# Patient Record
Sex: Female | Born: 1975 | Race: White | Hispanic: No | Smoking: Never smoker
Health system: Southern US, Community
[De-identification: ages and names within clinical notes are randomized; demographics above are authoritative.]

## PROBLEM LIST (undated history)

## (undated) DIAGNOSIS — M79602 Pain in left arm: Secondary | ICD-10-CM

## (undated) DIAGNOSIS — I447 Left bundle-branch block, unspecified: Secondary | ICD-10-CM

## (undated) DIAGNOSIS — R002 Palpitations: Secondary | ICD-10-CM

## (undated) DIAGNOSIS — R079 Chest pain, unspecified: Secondary | ICD-10-CM

## (undated) DIAGNOSIS — R0600 Dyspnea, unspecified: Secondary | ICD-10-CM

## (undated) HISTORY — PX: TONSILLECTOMY AND ADENOIDECTOMY: SHX28

## (undated) HISTORY — DX: Palpitations: R00.2

## (undated) HISTORY — DX: Pain in left arm: M79.602

## (undated) HISTORY — DX: Chest pain, unspecified: R07.9

## (undated) HISTORY — DX: Left bundle-branch block, unspecified: I44.7

## (undated) HISTORY — DX: Dyspnea, unspecified: R06.00

---

## 1998-11-18 ENCOUNTER — Other Ambulatory Visit: Admission: RE | Admit: 1998-11-18 | Discharge: 1998-11-18 | Payer: Self-pay | Admitting: *Deleted

## 1999-01-09 ENCOUNTER — Inpatient Hospital Stay (HOSPITAL_COMMUNITY): Admission: AD | Admit: 1999-01-09 | Discharge: 1999-01-09 | Payer: Self-pay | Admitting: Obstetrics & Gynecology

## 1999-01-09 ENCOUNTER — Encounter: Payer: Self-pay | Admitting: Obstetrics & Gynecology

## 1999-08-28 ENCOUNTER — Inpatient Hospital Stay (HOSPITAL_COMMUNITY): Admission: AD | Admit: 1999-08-28 | Discharge: 1999-08-30 | Payer: Self-pay | Admitting: Obstetrics & Gynecology

## 1999-08-31 ENCOUNTER — Encounter: Admission: RE | Admit: 1999-08-31 | Discharge: 1999-11-29 | Payer: Self-pay | Admitting: *Deleted

## 1999-11-29 ENCOUNTER — Other Ambulatory Visit: Admission: RE | Admit: 1999-11-29 | Discharge: 1999-11-29 | Payer: Self-pay | Admitting: Obstetrics & Gynecology

## 2000-12-08 ENCOUNTER — Other Ambulatory Visit: Admission: RE | Admit: 2000-12-08 | Discharge: 2000-12-08 | Payer: Self-pay | Admitting: Obstetrics and Gynecology

## 2002-01-07 ENCOUNTER — Inpatient Hospital Stay (HOSPITAL_COMMUNITY): Admission: AD | Admit: 2002-01-07 | Discharge: 2002-01-07 | Payer: Self-pay | Admitting: Obstetrics and Gynecology

## 2002-01-23 ENCOUNTER — Inpatient Hospital Stay (HOSPITAL_COMMUNITY): Admission: AD | Admit: 2002-01-23 | Discharge: 2002-01-25 | Payer: Self-pay | Admitting: Obstetrics and Gynecology

## 2002-03-07 ENCOUNTER — Other Ambulatory Visit: Admission: RE | Admit: 2002-03-07 | Discharge: 2002-03-07 | Payer: Self-pay | Admitting: Obstetrics and Gynecology

## 2003-02-19 ENCOUNTER — Other Ambulatory Visit: Admission: RE | Admit: 2003-02-19 | Discharge: 2003-02-19 | Payer: Self-pay | Admitting: Obstetrics and Gynecology

## 2004-08-04 ENCOUNTER — Other Ambulatory Visit: Admission: RE | Admit: 2004-08-04 | Discharge: 2004-08-04 | Payer: Self-pay | Admitting: Obstetrics and Gynecology

## 2005-03-24 ENCOUNTER — Inpatient Hospital Stay (HOSPITAL_COMMUNITY): Admission: AD | Admit: 2005-03-24 | Discharge: 2005-03-26 | Payer: Self-pay | Admitting: Obstetrics and Gynecology

## 2005-08-10 ENCOUNTER — Ambulatory Visit (HOSPITAL_COMMUNITY): Admission: RE | Admit: 2005-08-10 | Discharge: 2005-08-10 | Payer: Self-pay | Admitting: Family Medicine

## 2006-02-16 ENCOUNTER — Other Ambulatory Visit: Admission: RE | Admit: 2006-02-16 | Discharge: 2006-02-16 | Payer: Self-pay | Admitting: Obstetrics and Gynecology

## 2006-09-25 ENCOUNTER — Encounter: Admission: RE | Admit: 2006-09-25 | Discharge: 2006-09-25 | Payer: Self-pay | Admitting: Obstetrics and Gynecology

## 2008-03-14 ENCOUNTER — Encounter (INDEPENDENT_AMBULATORY_CARE_PROVIDER_SITE_OTHER): Payer: Self-pay | Admitting: Obstetrics and Gynecology

## 2008-03-14 ENCOUNTER — Ambulatory Visit (HOSPITAL_COMMUNITY): Admission: RE | Admit: 2008-03-14 | Discharge: 2008-03-14 | Payer: Self-pay | Admitting: Obstetrics and Gynecology

## 2008-12-10 ENCOUNTER — Inpatient Hospital Stay (HOSPITAL_COMMUNITY): Admission: AD | Admit: 2008-12-10 | Discharge: 2008-12-10 | Payer: Self-pay | Admitting: Obstetrics and Gynecology

## 2009-01-26 ENCOUNTER — Inpatient Hospital Stay (HOSPITAL_COMMUNITY): Admission: AD | Admit: 2009-01-26 | Discharge: 2009-01-28 | Payer: Self-pay | Admitting: Obstetrics and Gynecology

## 2011-01-19 LAB — CBC
HCT: 34.4 % — ABNORMAL LOW (ref 36.0–46.0)
Hemoglobin: 11.8 g/dL — ABNORMAL LOW (ref 12.0–15.0)
MCHC: 35.3 g/dL (ref 30.0–36.0)
MCV: 95.2 fL (ref 78.0–100.0)
Platelets: 205 10*3/uL (ref 150–400)
Platelets: 227 10*3/uL (ref 150–400)
RBC: 3.56 MIL/uL — ABNORMAL LOW (ref 3.87–5.11)
RDW: 13.6 % (ref 11.5–15.5)
WBC: 10 10*3/uL (ref 4.0–10.5)
WBC: 9.9 10*3/uL (ref 4.0–10.5)

## 2011-01-19 LAB — RPR: RPR Ser Ql: NONREACTIVE

## 2011-01-20 LAB — DIFFERENTIAL
Basophils Absolute: 0.1 10*3/uL (ref 0.0–0.1)
Basophils Relative: 1 % (ref 0–1)
Eosinophils Absolute: 0.1 10*3/uL (ref 0.0–0.7)
Eosinophils Relative: 1 % (ref 0–5)
Lymphocytes Relative: 18 % (ref 12–46)
Lymphs Abs: 1.7 10*3/uL (ref 0.7–4.0)
Monocytes Absolute: 0.7 10*3/uL (ref 0.1–1.0)
Monocytes Relative: 7 % (ref 3–12)
Neutro Abs: 7.2 10*3/uL (ref 1.7–7.7)
Neutrophils Relative %: 73 % (ref 43–77)

## 2011-01-20 LAB — CBC
HCT: 32.7 % — ABNORMAL LOW (ref 36.0–46.0)
Hemoglobin: 11.2 g/dL — ABNORMAL LOW (ref 12.0–15.0)
MCHC: 34.4 g/dL (ref 30.0–36.0)
MCV: 95.8 fL (ref 78.0–100.0)
Platelets: 215 10*3/uL (ref 150–400)
RBC: 3.41 MIL/uL — ABNORMAL LOW (ref 3.87–5.11)
RDW: 13.1 % (ref 11.5–15.5)
WBC: 9.8 10*3/uL (ref 4.0–10.5)

## 2011-01-20 LAB — URINALYSIS, ROUTINE W REFLEX MICROSCOPIC
Bilirubin Urine: NEGATIVE
Glucose, UA: NEGATIVE mg/dL
Hgb urine dipstick: NEGATIVE
Ketones, ur: NEGATIVE mg/dL
Nitrite: NEGATIVE
Protein, ur: NEGATIVE mg/dL
Specific Gravity, Urine: 1.015 (ref 1.005–1.030)
Urobilinogen, UA: 0.2 mg/dL (ref 0.0–1.0)
pH: 7 (ref 5.0–8.0)

## 2011-01-20 LAB — CULTURE, BETA STREP (GROUP B ONLY)

## 2011-01-20 LAB — GC/CHLAMYDIA PROBE AMP, GENITAL
Chlamydia, DNA Probe: NEGATIVE
GC Probe Amp, Genital: NEGATIVE

## 2011-01-20 LAB — WET PREP, GENITAL
Clue Cells Wet Prep HPF POC: NONE SEEN
Trich, Wet Prep: NONE SEEN
Yeast Wet Prep HPF POC: NONE SEEN

## 2011-02-11 ENCOUNTER — Other Ambulatory Visit: Payer: Self-pay | Admitting: Obstetrics and Gynecology

## 2011-02-15 ENCOUNTER — Ambulatory Visit
Admission: RE | Admit: 2011-02-15 | Discharge: 2011-02-15 | Disposition: A | Discharge status: Home / Self Care | Payer: BC Managed Care – PPO | Source: Ambulatory Visit | Attending: Obstetrics and Gynecology | Admitting: Obstetrics and Gynecology

## 2011-02-15 ENCOUNTER — Other Ambulatory Visit: Payer: Self-pay | Admitting: Obstetrics and Gynecology

## 2011-02-22 NOTE — Op Note (Signed)
NAME:  Denise Fowler, Denise Fowler              ACCOUNT NO.:  [id number]   MEDICAL RECORD NO.:  [id number]          PATIENT TYPE:  AMB   LOCATION:  SDC                           FACILITY:  [facility]   PHYSICIAN:  Hal Morales, M.D.DATE OF BIRTH:  12-19-1975   DATE OF PROCEDURE:  DATE OF DISCHARGE:                               OPERATIVE REPORT   PREOPERATIVE DIAGNOSIS:  Missed abortion at 69 weeks' gestation.   POSTOPERATIVE DIAGNOSIS:  Missed abortion at 12 weeks' gestation.   OPERATION:  Suction dilatation and evacuation.   SURGEON:  Hal Morales, MD   ANESTHESIA:  General mask.   FINDINGS:  The uterus was enlarged to 12-week size with a large amount  of products of conception.   SPECIMENS:  Products of conception.   DISPOSITION:  Sent to pathology.   ESTIMATED BLOOD LOSS:  Less than 50 mL.   COMPLICATIONS:  None.   FINDINGS:  The uterus was enlarged to 12-week size with a large amount  of products of conception.   PROCEDURE:  The patient was taken to the operating room after  appropriate identification and placed on the operating table.  After the  attainment of adequate general anesthesia, she was placed in lithotomy  position.  The perineum and vagina were prepped with multiple layers of  Betadine and draped as a sterile field.  A red Robinson catheter was  used to empty the bladder.  A Graves speculum was placed in the vagina.  A paracervical block was achieved with a total of 10 mL of 2% Xylocaine  in the 5 and 7 o'clock positions.  A single-tooth tenaculum was placed  on the cervix.  The cervix was dilated to accommodate a #12 suction  catheter, and it was used to suction all contents from all quadrants of  the uterus.  A sharp curette was used to ensure that no further products  of conception could be identified.  Hemostasis was noted to be adequate.  The patient was awakened from general anesthesia and taken to the  recovery room in satisfactory condition having  tolerated the procedure  well with sponge and instrument counts correct.  She received Toradol 30  mg IV and 30 mg IM in the operating room.  She received Methergine 0.2  mg IM in the operating room.   DISCHARGE MEDICATIONS:  1. Ibuprofen 600 mg p.o. q.6 h. p.r.n. pain.  2. Methergine 0.2 mg p.o. q.6 h. x8 doses.  3. Doxycycline 100 mg p.o. b.i.d. for 7 days.   DISCHARGE INSTRUCTIONS:  Printed instructions from the [facility]  for [facility].   FOLLOW UP INSTRUCTIONS:  The patient has a 2-week appointment to follow  up with Dr. Pennie Rushing.      Hal Morales, M.D.  Electronically Signed     VPH/MEDQ  D:  03/14/2008  T:  03/15/2008  Job:  [id number]

## 2011-02-22 NOTE — H&P (Signed)
NAMEJOHNNA, BOLLIER NO.:  [id number]   MEDICAL RECORD NO.:  [id number]          PATIENT TYPE:  INP   LOCATION:  9165                          FACILITY:  [facility]   PHYSICIAN:  Janine Limbo, M.D.DATE OF BIRTH:  02-23-1976   DATE OF ADMISSION:  01/26/2009  DATE OF DISCHARGE:                              HISTORY & PHYSICAL   Ms. Kendra is a 35 year old gravida 6, para 3-0-2-3 at 37 weeks who  presented complaining of uterine contractions every 5-6 minutes for  several hours.  Her cervix has been 2-3 in the office today.   PREGNANCY:  Has been remarkable for:  1. Negative group B strep.  2. Preterm labor this pregnancy.  3. Two previous 36-37-week deliveries.   PRENATAL LABS:  Blood type is A+, Rh antibody negative.  Urine  nonreactive.  Rubella titer positive.  Hepatitis B surface antigen  negative.  HIV is nonreactive.  GC and chlamydia cultures were declined.  Pap was normal in July 2009.  Cystic fibrosis testing was declined.  First trimester screen was normal.  AFP was declined.  Hemoglobin upon  entering the practice was 12.6; it was 10.9 at 27 weeks.  She had a  normal Glucola.  She had a fetal fibronectin on December 10, 2008 that was  negative, and on December 25, 2008 that was negative.  Group B strep  culture was negative at 36 weeks.   HISTORY OF PRESENT PREGNANCY:  The patient entered care at approximately  10 weeks.  She had an ultrasound that day for inability to hear fetal  heart tones.  Her EDC was Feb 16, 2009, which was consistent with dates.  She planned first trimester screen.  HIV was negative.  First trimester  screen was normal.  AFP was declined.  She had an 18-week ultrasound  which showed normal growth and development.  She was treated for a  dental abscess at 22 weeks.  At 29 weeks she began to have some preterm  labor symptoms.  She was seen in maternity admissions unit; fetal  fibronectin was negative.  She was placed on Motrin for 24  hours and  then terbutaline p.r.n.  She just used the terbutaline on a p.r.n. basis  until 36 weeks.  Fetal fibronectin was repeated at 32 weeks and was  negative.  She had a beta strep test done at 35 weeks, which was  negative.  She had an ultrasound at 32 weeks, showing normal growth and  fluid, and a normal cervical length.  The rest of her pregnancy has been  uncomplicated.   OBSTETRICAL HISTORY:  1. In 2000 she had a vaginal birth of a female infant; weight 7 pounds      3 ounces at 39 weeks.  She was in labor 6 hours.  She had positive      beta strep with that pregnancy.  2. In 2003 she had a vaginal birth of a female infant; weight 7 pounds      13 ounces at 37-5/7 weeks.  She had 3 hours of labor.  She had  local anesthesia with that.  3. In 2005 she had a first trimester loss, with a faint positive EPT      and a negative HCG.  4. In 2006 she had a vaginal birth of a female infant; weight 6 pounds      13 ounces at 36-37 weeks.  She was in labor 3 hours.  She did have      an epidural and had no problems.  5. In June 2009 she had a first trimester loss and had a D and C.  6. She was a previous oral contraceptive user.  7. She had beta strep with her first pregnancy.   MEDICAL HISTORY:   ALLERGIES:  She is allergic to PENICILLIN (this was a childhood  reaction).   SURGICAL HISTORY:  Includes:  1. Tonsils at age 77.  2. Wisdom teeth at age 79.  3. Previously noted D and C in 2009.   She also some occasional anemia.   FAMILY HISTORY:  Maternal grandfather had chronic hypertension.  Maternal grandfather had diabetes.  Paternal grandfather had prostate  cancer.  Maternal grandfather had brain cancer.  Maternal grandmother  had bladder cancer.  Mother and maternal grandfather have depression.   GENETIC HISTORY:  Remarkable for the father of the baby and the patient  being very distant cousins.   SOCIAL HISTORY:  The patient is married to the father of baby; he  is  involved and supportive (his name is Adalberto Cole).  The patient is  Caucasian.  She is of the [place] faith.  She has a bachelor's degree  and she is a Designer, jewellery and a Designer, television/film set.  Her husband has  a bachelor's degree.  He is employed at [employer].  She has been followed by  the certified nurse midwife service [facility].  She denies any  alcohol, drug or tobacco use during this pregnancy.   PHYSICAL EXAMINATION:  VITAL SIGNS:  Stable.  The patient is febrile.  HEENT:  Within normal limits.  LUNGS:  Breath sounds are clear.  HEART:  Regular rate and rhythm without murmur.  BREASTS:  Soft and nontender.  ABDOMEN:  Fundal height is approximately 38 cm.  Estimated fetal weight  6-7 pounds.  Uterine contractions are every 4-5 minutes; 60th and 2nd of  moderate quality.  CERVIX:  External os 5 cm, internal os 3-4 cm; 60% vertex at a minus two  station, bulging bag of water.  Positive bloody show.  EXTREMITIES:  Deep tendon reflexes are 2+ without clonus.  There is a  trace edema noted.   IMPRESSION:  1. Intrauterine pregnancy at 37 weeks.  2. Early labor.  3. Group B strep negative.   PLAN:  1. Admit to birthing suite, for consult with Dr. Marline Backbone as      attending physician.  2. Routine certified nurse midwife orders.  3. We will encourage ambulation at present and then reevaluate for      epidural and artificial rupture of membranes later.      Renaldo Reel Emilee Hero, C.N.M.      Janine Limbo, M.D.  Electronically Signed    VLL/MEDQ  D:  01/26/2009  T:  01/26/2009  Job:  [id number]

## 2011-02-22 NOTE — H&P (Signed)
NAME:  Denise Fowler, Denise Fowler              ACCOUNT NO.:  [id number]   MEDICAL RECORD NO.:  [id number]          PATIENT TYPE:  AMB   LOCATION:  SDC                           [facility]:  [facility]   PHYSICIAN:  Hal Morales, M.D.DATE OF BIRTH:  [birth year]   DATE OF ADMISSION:  DATE OF DISCHARGE:                              HISTORY & PHYSICAL   Denise Fowler is a 35 year old married white female, gravida 5, para 2-1-  1-3, who presented to the [facility] office for first  trimester screen but was shown by ultrasound to have a 12-week 2-day  intrauterine fetal demise.  She denies any pain or any vaginal bleeding.  Her pregnancy has been followed by the [facility] certified  nurse midwife service and has been remarkable for:   1. Penicillin allergy.  2. Irregular cycles.   Her prenatal labs were collected on Feb 15, 2008:  Hemoglobin 11.5,  hematocrit 34.2, platelets 339,000.  Blood type A+, antibody negative.  RPR nonreactive.  Rubella immune.  Hepatitis B surface antigen negative.  HIV nonreactive.  Pap smear, gonorrhea and chlamydia cultures were not  done but were planning to be done after March 10, 2008.  The patient's  weight at her visit today was 133 pounds.   HISTORY OF PRESENT PREGNANCY:  The patient presented for a first  trimester ultrasound on February 06, 2008, at 7 weeks 6 days with best [facility]  September 18, 2008.  Fetal heart tones at that ultrasound were 171.  She  had her new OB workup on Feb 15, 2008, at 9-1/7 weeks' gestation.  No  heart tones were auscultated, and then the next visit was today for her  first trimester screen.   OB HISTORY:  She is a gravida 5, para 2-1-1-3.  In November 2000 she had  a vaginal delivery of a female infant weighing 7 pounds 3 ounces at 41  weeks' gestation after 6 hours of labor.  She had no anesthesia.  Infant's name was Jeanie Cooks.  In April 2003 she had a vaginal delivery of a  female infant weighing 7 pounds 13 ounces at 37-5/7  weeks' gestation  after 3 hours of labor.  She had no anesthesia.  Infant's name was  Dahlia Client.  In May 2005 she had a possible early SAB with faint positive  HPT and negative serum pregnancy test.  In June 2006 she had a vaginal  delivery of a female infant weighing 6 pounds 13 ounces at 36 weeks'  gestation after 3 hours of labor.  She had an epidural for anesthesia.  Infant's name was Orpha Bur.  This fifth pregnancy is the current pregnancy.   PAST MEDICAL HISTORY:  She has a PENICILLIN allergy.   She experienced menarche at the age of 5 with irregular cycles lasting  5 days.  She has taken oral contraceptives in the past.  She has an  occasional yeast infection.  She had group B strep with her first  pregnancy.  She reports having had the usual childhood illnesses.  She  has an occasional anemia that has been diagnosed.   Surgical  history is remarkable for a tonsillectomy at the age of 50,  wisdom teeth extraction at the age of 75.   FAMILY MEDICAL HISTORY:  Maternal grandfather with chronic hypertension  and maternal grandfather also with diabetes.  Paternal grandfather with  brain cancer.  Mother and maternal grandfather with depression.   GENETIC HISTORY:  Remarkable that father of the baby is a distant  cousin.   SOCIAL HISTORY:  Patient is married to the father of the baby.  His name  is Thayer Ohm.  He is involved and supportive.  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  Father of the baby has his bachelor's and works full-time a Arts administrator.   OBJECTIVE:  VITAL SIGNS:  Stable.  She is afebrile.  HEENT:  Grossly within normal limits.  CHEST:  Clear to auscultation.  HEART:  Regular rate and rhythm.  ABDOMEN:  Gravid in contour with fundal height extending approximately  12 cm above pubic symphysis.  Abdomen is soft and nontender.  EXTREMITIES:  Within normal limits.   Ultrasound at [facility] shows a 12-week 2-day fetus  with crown-  rump length of 5.76 cm with absent cardiac rhythm and anterior placenta.   ASSESSMENT:  Missed abortion at 12 weeks 2 days.   PLAN:  Options were reviewed with the patient including expectant  management and surgical management.  The patient elects for a D&C as  soon as possible.  She last ate at 8 a.m. and per Dr. Pennie Rushing, plan is  for the patient to report to [facility] for a D&C at approximately  3:15 p.m. this afternoon, which is March 14, 2008.      Cam Hai, C.N.M.      Hal Morales, M.D.  Electronically Signed    KS/MEDQ  D:  03/14/2008  T:  03/14/2008  Job:  [id number]

## 2011-02-25 NOTE — H&P (Signed)
[facility]  Patient:    Denise Fowler, Denise Fowler Visit Number: [id number] MRN: [id number]          Service Type: OBS Location: [id number] Attending Physician:  Jaymes Graff A Dictated by:   Nigel Bridgeman, C.N.M. Admit Date:  01/23/2002                           History and Physical  CHIEF COMPLAINT:              Ms. Gotschall is a 35 year old gravida 2 para 1, 0-0-1, at 9 weeks, who presents for induction secondary to prolonged prodromal labor, history of rapid labor, favorable cervix, and distance from the hospital.  HISTORY OF PRESENT ILLNESS:   The pregnancy has been remarkable for:                               1. History of rapid labor.                               2. Patient is an R.N.                               3. History of preterm labor, with cervix 2-3 cm                                  at 35 weeks.                               4. Prolonged prodromal labor.  PRENATAL LABORATORY DATA:     Blood type is A-positive.  Rh antibody negative. VDRL nonreactive.  Rubella titer positive.  Hepatitis B surface antigen negative.  HIV nonreactive.  GC and Chlamydia cultures were negative.  Pap smear normal in March 2003.  Glucose challenge was normal.  AFP was declined. Hemoglobin upon entry into practice was 12.1, was 11.3 at 27 weeks. Group B strep culture was negative during this pregnancy.  EDC of Feb 08, 2002 was established by last menstrual period and is in agreement with ultrasound at approximately six and 19 weeks.  HISTORY OF PRESENT PREGNANCY: The patient entered care at approximately ten weeks.  She had had an intrauterine pregnancy documented by ultrasound in September 2002.  She had another ultrasound at 19 weeks that showed normal growth and fluid.  She declined AFP.  She did have some severe back pain beginning at approximately 30 weeks.  She was referred to [facility].  Beta strep was recollected at 35 weeks, it was negative.  She began  to have contractions at approximately 35 weeks and the cervix was 2.5, 70%, vertex at -1 station. From that point on she continued to have significant ongoing contractions. She did have blood and ketones in her urine and protein at 36 weeks.  This was evaluated with a 24 hour value that showed 273 mg of protein but also had blood, but then that did clear after 36 weeks.  Again, she continued to contract significantly, although her cervix never advanced beyond 3-4 cm dilated.  She had significant exhaustion.  Therefore, she was admitted today for induction secondary to the issues as detailed  previously.  PAST OBSTETRICAL HISTORY:     In November 2000 she had a vaginal birth of a female infant, weight 7 pounds 3 ounces, at 39 weeks.  She was in labor six hours.  She had no complications.  She had a second degree laceration.  PAST MEDICAL HISTORY:         1. She was on oral contraceptives in the past.                               2. She had a history of previous irregular                                  cycles and took Prometrium every month for                                  menses after her last pregnancy.                               3. She has occasional yeast infections.                               4. She was positive for beta strep last                                  pregnancy, but her infant had no effect.                               5. She reports usual childhood illnesses.                               6. She has a history of a heart murmur but                                  no medications and no problems.                               7. She has a history of mild anemia.                               8. She had a UTI in February 2000.                               9. At age 64 she had her tonsils removed.                              10. At age 39 she had her wisdom teeth taken  out.                              11. Her only other  hospitalization was for                                  childbirth.  ALLERGIES:                    PENICILLIN, which causes a rash.  FAMILY HISTORY:               Her maternal grandfather had hypertension.  Her paternal great-grandmother had varicosities.  Her mother and maternal grandmother and paternal grandmother all had anemia.  Her maternal grandfather had non-insulin dependent diabetes.  Paternal grandfather died of brain cancer.  Maternal grandfather had depression.  Her mother also has had a history of depression.  GENETIC HISTORY:              Remarkable for the father of the baby having a distant cousin with mental retardation.  SOCIAL HISTORY:               The patient is married to the father of the baby.  He is involved and supportive.  His name is Ritu Gagliardo.  The patient is Caucasian, of the [address] faith.  She is college educated.  She is employed as a Designer, jewellery at [employer].  The patients husband has one year of technical school.  He is employed as a Presenter, broadcasting. She has been followed by the Certified Nurse Midwife service at [facility].  She denies any alcohol, drug, or tobacco use during this pregnancy.  PHYSICAL EXAMINATION:  VITAL SIGNS:                  Stable.  The patient is afebrile.  HEENT:                        Within normal limits.  LUNGS:                        Bilateral breath sounds are clear.  HEART:                        Regular rate and rhythm without murmur.  BREAST:                       Soft, nontender.  ABDOMEN:                      Fundal height approximately 38 cm.  Estimated fetal weight 6-1/2 to 7 pounds.  Uterine contractions are every five to eight minutes, mild to moderate in quality.  PELVIC:                       Cervical examination on admission 3-4 cm, 75%, vertex at -1 station.  Fetal heart rate is reactive with no decelerations. There was an episode of baseline 160-170 for 15-20  minutes but then that did resolve itself to the original baseline.  No decelerations were noted.  EXTREMITIES:                  Deep tendon reflexes are 2+ without clonus.  There is trace  edema noted.  IMPRESSION:                   1. Intrauterine pregnancy at 38 weeks.                               2. Prolonged prodromal labor.                               3. History of rapid labor.                               4. Favorable cervix.                               5. Distance from hospital.  PLAN:                         1. Admit to birthing suite per consult                                  with Dr. Silverio Lay, attending                                  physician.                               2. Routine Certified Nurse Midwife orders.                               3. Plan artificial rupture of membranes and                                  augmentation with Pitocin on as as needed                                  basis.                               4. Patient declines epidural at this time but                                  will reevaluate as time goes along. Dictated by:   Nigel Bridgeman, C.N.M. Attending Physician:  Michael Litter DD:  01/23/02 TD:  01/23/02 Job: [id number]

## 2011-02-25 NOTE — H&P (Signed)
Denise Fowler, Denise Fowler              ACCOUNT NO.:  [id number]   MEDICAL RECORD NO.:  [id number]          PATIENT TYPE:  INP   LOCATION:  9160                          FACILITY:  [facility]   PHYSICIAN:  Hal Morales, M.D.DATE OF BIRTH:  [birth year]   DATE OF ADMISSION:  03/24/2005  DATE OF DISCHARGE:                                HISTORY & PHYSICAL   Denise Fowler is a 35 year old gravida 4, para 2-0-1-2 at 36-1/7 weeks who  presented with uterine contractions every two to three minutes.  During the  night she denies leaking or bleeding and reports positive fetal movement.  Pregnancy has been remarkable for group B Strep culture pending from June  12.  Sensitivities were requested secondary to penicillin allergy, small  body habitus, irregular cycles, penicillin allergy.   PRENATAL LABORATORIES:  Blood type is A+.  Rh antibody negative.  VDRL  nonreactive.  Rubella titer positive.  Hepatitis B surface antigen negative.  HIV and cystic fibrosis testing were declined.  GC and Chlamydia cultures  were declined.  Pap was normal.  AFP was declined.  Hemoglobin upon entering  to practice was 12.  It was 11.7 at 27 weeks.  EDC of April 19, 2005 was  established by 9-week ultrasound and was in agreement with LMP.  Group B  Strep culture was done on June 12 and is currently pending.   HISTORY OF PRESENT PREGNANCY:  Patient began prenatal care at approximately  8 weeks.  She had an ultrasound approximately 9 weeks for dating.  She  declined quadruple screen.  She had an ultrasound at 18 weeks showing normal  growth and development.  She had a normal Glucola at 107.  She had cramping  beginning at approximately 29 weeks.  She was free of her UTI at that time.  Fetal fibronectin was sent which was negative.  Fetal fibronectin was  repeated again at 31 weeks which was again negative.  Her cervix was 1 cm  external os, closed internal os at 31 weeks.  The rest of her pregnancy was  essentially  uncomplicated; however, she did have significant cramping  throughout the rest of her pregnancy.  Over the last two to three days she  has noted increased contractions.   PAST OBSTETRICAL HISTORY:  In 2000 she had a vaginal birth of a female  infant weight 7 pounds 3 ounces at 39 weeks.  She had six hours of labor.  She was positive group B Strep during that pregnancy.  In 2003 she had a  vaginal birth of a female infant weight 7 pounds 13 ounces at 37-5/7 weeks.  She was in labor three hours.  She had local anesthesia.  She had negative  group B Strep at that time.  In May of 2005 she had questionable early SAB.  She had a faint positive home EPT, but a negative serum pregnancy test.   PAST MEDICAL HISTORY:  She was a pregnancy oral contraceptive user.  She has  a history of irregular cycles, occasional yeast infections.  She reports  usual childhood illnesses.  She has occasional anemia.  PAST SURGICAL HISTORY:  Tonsils at age 19, wisdom teeth at age 51.  Her only  other hospitalizations were for childbirth.   ALLERGIES:  PENICILLIN which was a childhood reaction with a rash.   FAMILY HISTORY:  Maternal grandfather has hypertension.  Maternal great  grandmother had varicosities.  Maternal grandfather had non-insulin-  dependent diabetes.  Maternal grandfather and mother have depression.  Maternal grandfather had brain cancer.   GENETIC HISTORY:  Remarkable for the father of the baby being a distant  cousin.   SOCIAL HISTORY:  Patient is married to the father of the baby.  He is  involved and supportive.  His name is Matie Dimaano.  Patient has graduate  education.  She is employed as a Designer, jewellery with a drug company.  Her  husband has one year of college.  He is employed as a Scientist, water quality.  She has  been followed by the certified nurse midwife service at [facility].  She denies any alcohol, drug, or tobacco use during this pregnancy.   PHYSICAL EXAMINATION:   VITAL SIGNS:  Stable.  Patient is afebrile.  HEENT:  Within normal limits.  LUNGS:  Bilateral breath sounds are clear.  HEART:  Regular rate and rhythm without murmur.  BREASTS:  Soft and nontender.  ABDOMEN:  Fundal height is approximately 36 cm.  Estimated fetal weight 6  pounds.  Uterine contractions are every two to four minutes, moderate to  strong quality.  PELVIC:  Cervical examination is 2-3 cm, 60%, vertex at a -1 station with  cervix more anterior after ambulation.  EXTREMITIES:  Deep tendon reflexes are 2+ without clonus.  There is a trace  edema noted.  Fetal heart rate is reactive with no decelerations and a  negative spontaneous CFP.   IMPRESSION:  1.  Intrauterine pregnancy at 36-1/7 weeks.  2.  Early labor.  3.  Pending group B Strep.   PLAN:  1.  Admit to birthing suite for consult with Dr. Osborn Coho as the      current on-call physician.  2.  Routine certified nurse midwife orders.  3.  Will plan group B Strep prophylaxis with clindamycin secondary to      pending group B Strep culture, 36-week gestation, and penicillin      allergy.  4.  Artificial rupture of membranes after antibiotic infused.  5.  Patient declines pain medication at this time.       VLL/MEDQ  D:  03/24/2005  T:  03/24/2005  Job:  [id number]

## 2011-02-25 NOTE — H&P (Signed)
[facility]  Patient:    Denise Fowler                        MRN: [id number] Adm. Date:  [id number] Attending:  Cleatrice Burke Dictator:   Erin Sons, C.N.M.                         History and Physical  HISTORY OF PRESENT ILLNESS:   Denise Fowler is a 35 year old, gravida 1, para 0, at 39-1/7 weeks who presented to maternity admissions unit with cramping today. Cervix had been 4 cm in the office this week.  She denied any leaking or bleeding and reports positive fetal movement.  Pregnancy has been remarkable for: 1) Positive Group B Strep.  2) Penicillin allergy.  3) UTI first trimester. 4) Right breast lump.  PRENATAL LABORATORY DATA:     Blood type is A positive, rh antibody negative, VDRL nonreactive, rubella titer positive, hepatitis B surface antigen negative, HIV nonreactive, Glucose Challenge was normal, AFP was normal.  Hemoglobin upon entry into practice was 13.  It was 10.6 at 27 weeks.  EDC of September 03, 1999, was established by last menstrual period and was in agreement with ultrasound at approximately seven weeks. In addition Group B Strep culture was positive on urine in the first trimester.  HISTORY OF PRESENT PREGNANCY: The patient entered care at approximately 10 weeks. She had had a positive urine culture and UTI which was treated with Cleocin. She had an ultrasound at [facility] at seven weeks documented positive intrauterine pregnancy. She did have a right breast lump that was evaluated at her new OB visit.  She did have an upper respiratory tract infection during her pregnancy at approximately 31 weeks for which she received a Z-Pak.  She had decreased fetal movement at approximately 30 weeks and had a reactive NST.  She had no other prenatal complications.  PAST OBSTETRIC HISTORY:       The patient is a primigravida.  PAST MEDICAL HISTORY:         She was on oral contraceptives until July of 1999. She did have  a history of irregular menses and took Prometrium every month for menses since August of 1999.  She reports one yeast infection in the past.  She has the usual childhood diseases.  She has a history of heart murmurs, but has never required any prophylaxis.  She does have a history in the past of anemia, but no problems.  She had a UTI in February of 2000.  At age 62 she had a tonsillectomy. At age 35 she had her wisdom teeth removed.  FAMILY HISTORY:               Her maternal grandfather had hypertension.  Her paternal great grandmother had thrombophlebitis.  Her mother, maternal grandmother, and paternal grandmother all were anemic.  Her maternal grandfather had non-insulin-dependent diabetes mellitus.  Her paternal grandfather had brain cancer and is now deceased.  Her paternal grandmother had an inner ear problem.  Her maternal grandfather had depression and her mother also had questionable depression.  GENETIC HISTORY:              Remarkable for the father of the infant having a fourth cousin with cystic fibrosis.  SOCIAL HISTORY:               The patient is married to the father of the baby.  He is involved and supportive.  His name is Tyronda Vizcarrondo.  The patient is Caucasian and of the [facility] faith.  She has been followed by the certified nurse midwife  service of [facility] and Gynecology.  She denies any alcohol,  drug, or tobacco use during this pregnancy.  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  Her husband as one year of college and is employed as a Scientist, water quality.  ALLERGIES:                    PENICILLIN which causes a rash.  PHYSICAL EXAMINATION:  VITAL SIGNS:                  Stable.  The patient is afebrile.  HEENT:                        Within normal limits.  LUNGS:                        Bilateral breath sounds are clear.  HEART:                        Regular rate and rhythm  without murmur.  BREASTS:                      Soft and nontender.  ABDOMEN:                      Fundal height is approximately 38 cm.  Estimated fetal weight 7 to 7-1/2 pounds.  Uterine contractions are irregular and mild.  PELVIC:                       Cervical examination 6+ cm, 100%, and vertex at -1 station with bulging bag of water noted.  Fetal heart rate is reactive without decellerations.  EXTREMITIES:                  Deep tendon reflexes are 2+ without clonus. There is a trace edema noted.  IMPRESSION:                   1. Intrauterine pregnancy at 39-1/7 weeks.                               2. Advanced cervical change with minimal labor.                               3. Positive Group B Strep.                               4. Penicillin allergy.  PLAN:                         1) Admit to birthing suite per consult with Cecilio Asper, M.D. as attending physician.  2) Routine certified nurse midwife  orders.  3) Plan Group B Strep prophylaxis with Clindamycin.  4) Anticipate normal spontaneous vaginal delivery. DD:  08/28/99 TD:  08/29/99 Job: [id number]

## 2011-07-07 LAB — CBC
HCT: 36
Hemoglobin: 12.6
MCHC: 35
RBC: 3.93

## 2012-01-02 ENCOUNTER — Ambulatory Visit (INDEPENDENT_AMBULATORY_CARE_PROVIDER_SITE_OTHER): Payer: BC Managed Care – PPO | Admitting: Obstetrics and Gynecology

## 2012-01-02 DIAGNOSIS — N390 Urinary tract infection, site not specified: Secondary | ICD-10-CM

## 2012-01-02 DIAGNOSIS — Z01419 Encounter for gynecological examination (general) (routine) without abnormal findings: Secondary | ICD-10-CM

## 2012-06-14 ENCOUNTER — Other Ambulatory Visit: Payer: Self-pay | Admitting: Dermatology

## 2012-08-02 IMAGING — MG MM DIGITAL DIAGNOSTIC BILAT CAD
6 series · 6 of 6 positions shown · non-contrast
Comparison: 09/25/2006

CLINICAL DATA: The patient feels two lumps in the right upper
outer quadrant.  She feels pain and a small lump in the right
axilla.

DIGITAL DIAGNOSTIC BILATERAL MAMMOGRAM WITH CAD AND RIGHT BREAST
ULTRASOUND:

[R CC]
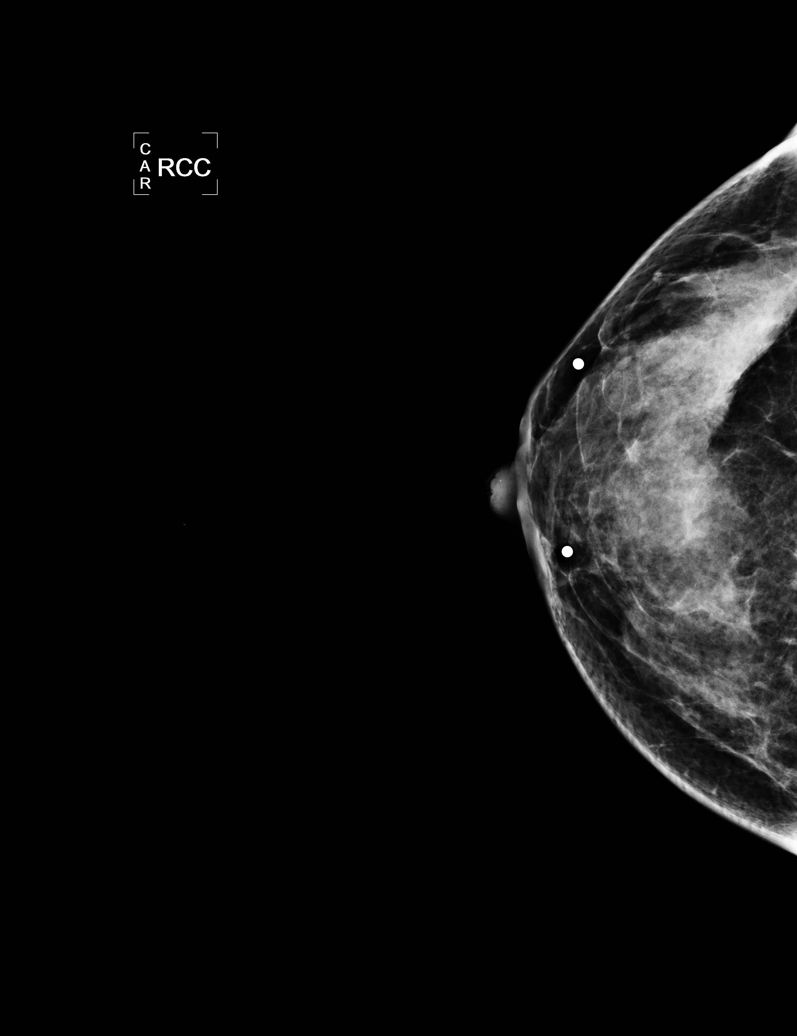

[L CC]
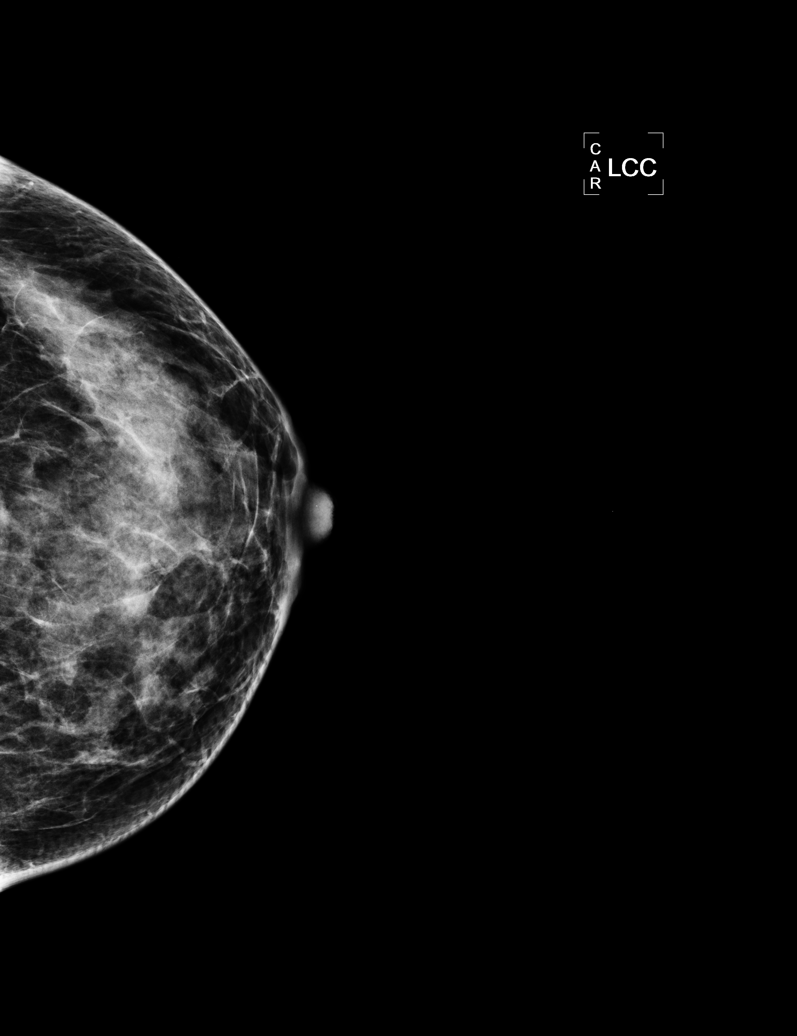

[L MLO]
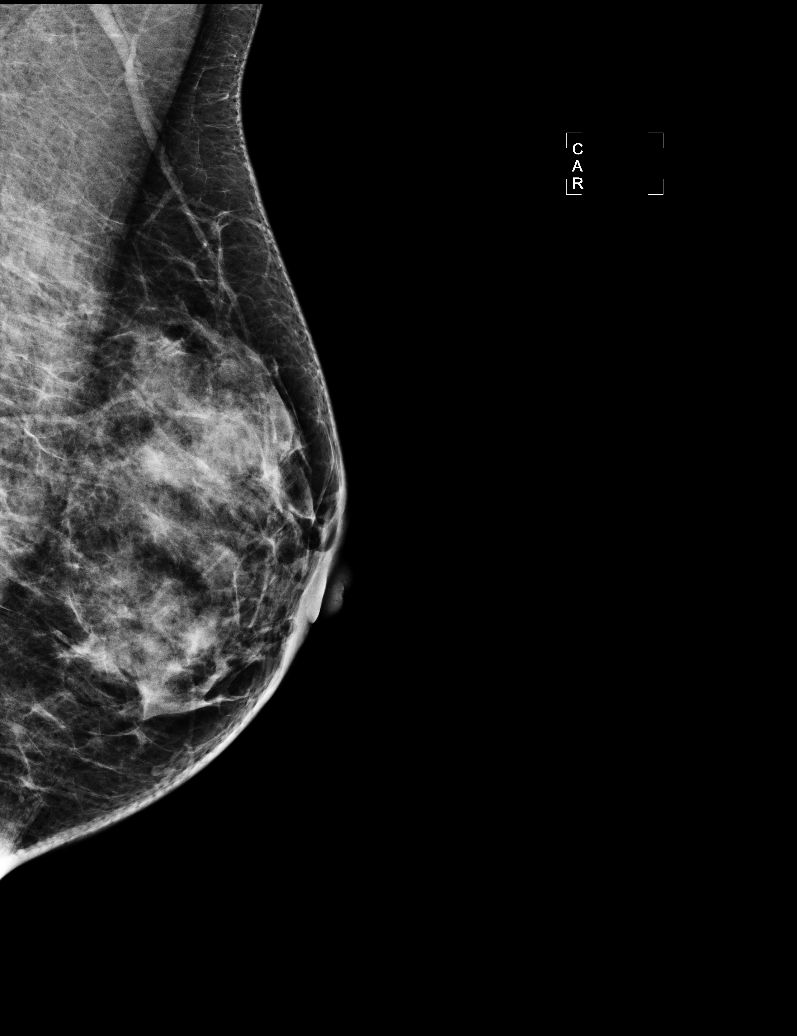

[R MLO]
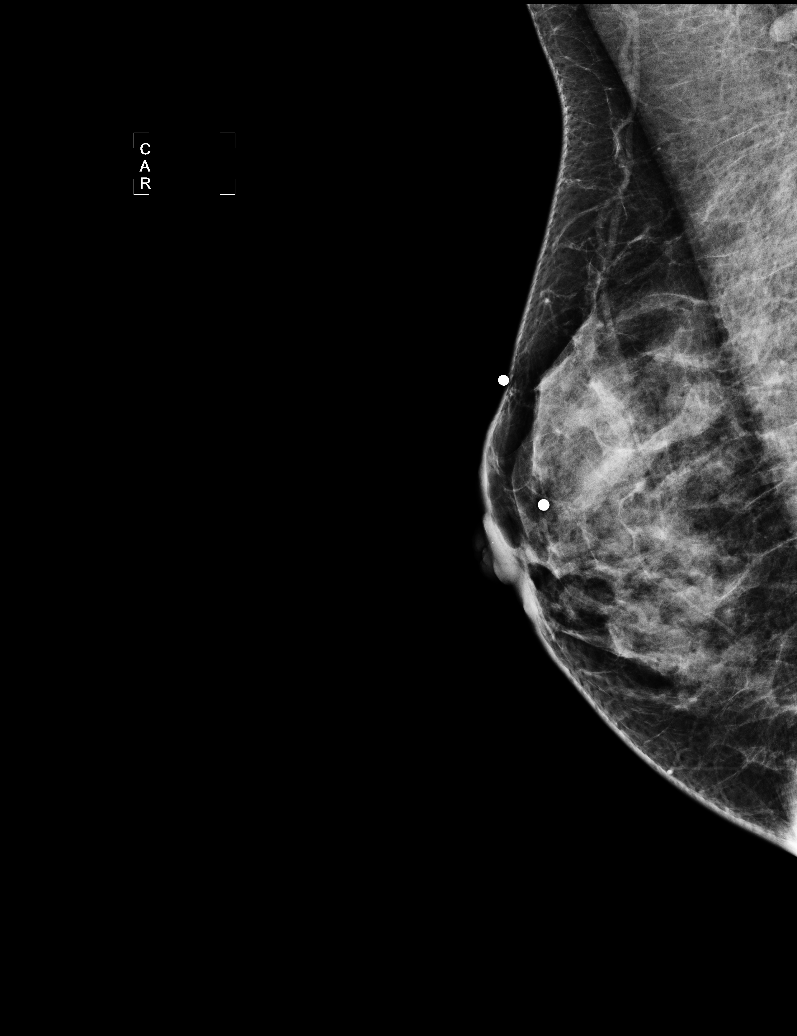

[R TAN (1 of 2)]
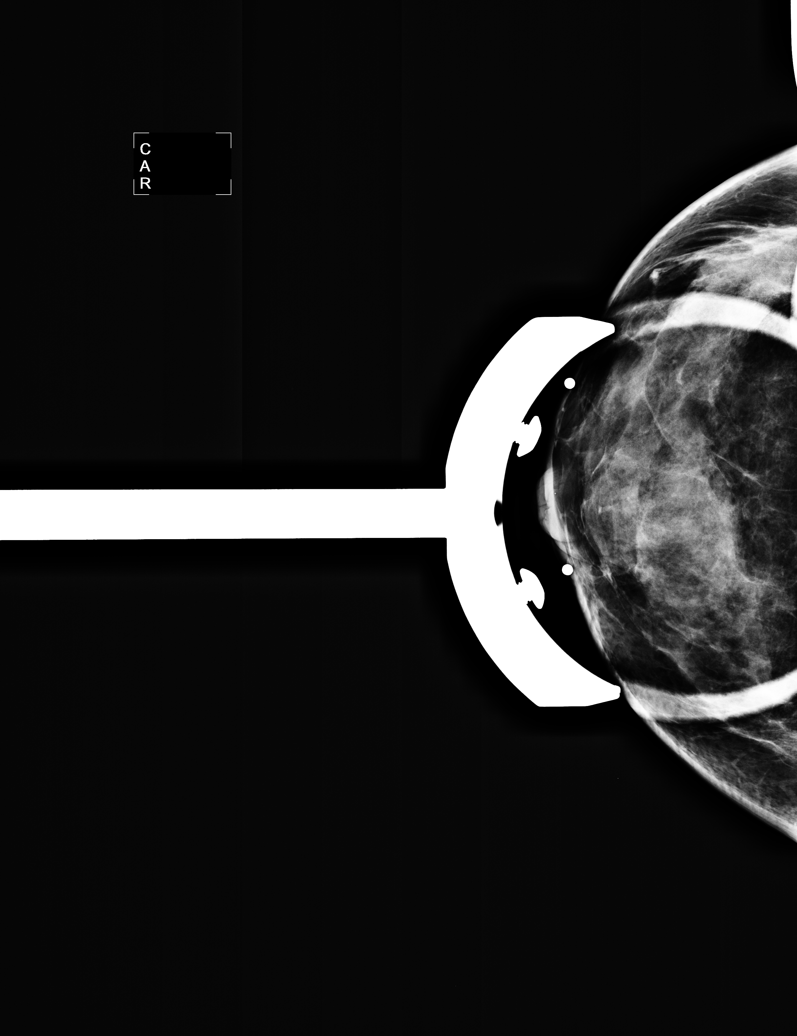

[R TAN (2 of 2)]
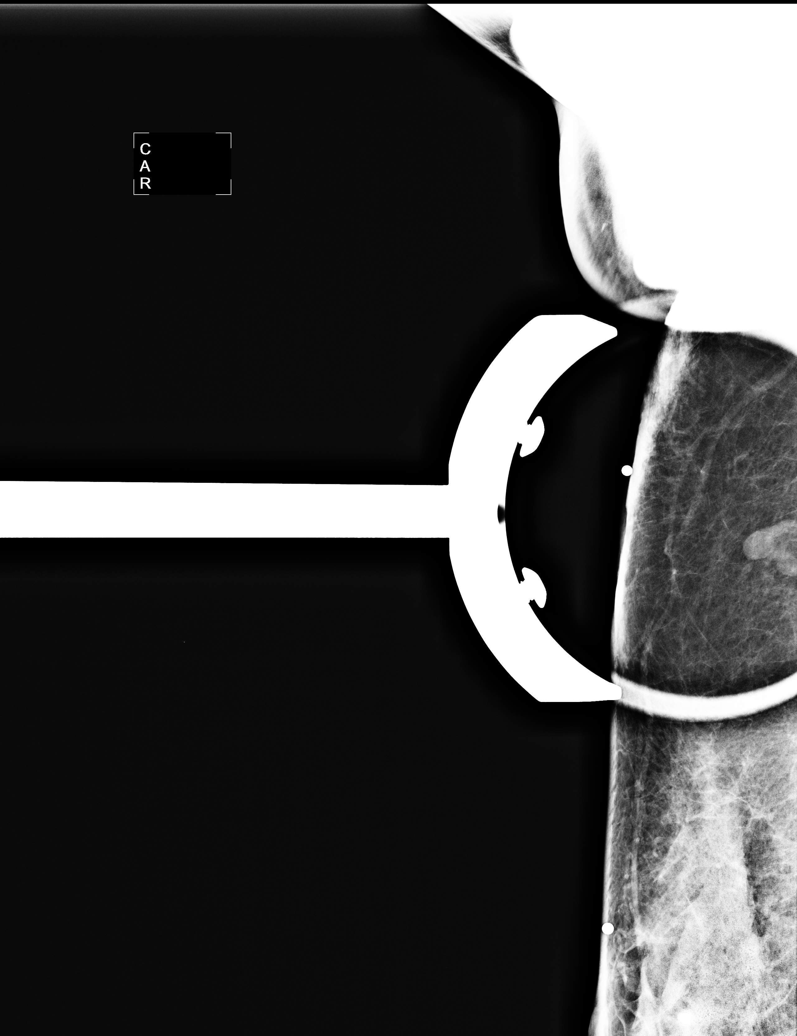

[6 of 6 positions shown; findings below may reference images not displayed]

FINDINGS: The breast tissue is heterogeneously dense.  There is no
dominant mass, architectural distortion or calcification to suggest
malignancy.
Mammographic images were processed with CAD.

On physical exam, I palpate mild fibroglandular thickening between
10 and 12, 2 cm from the right nipple.  There is no distinct mass.
No mass is palpated in the right axilla.

Ultrasound is performed, showing dense fibroglandular tissue
between 10 and 12 o'clock in the area of concern to the patient.
There is no mass, distortion or shadowing to suggest malignancy.
Only normal axillary contents are noted in the right axilla.
IMPRESSION: No mammographic or sonographic evidence of malignancy.  Yearly
screening mammography is suggested beginning at age 40 unless
clinically indicated earlier.

BI-RADS CATEGORY 1:  Negative.

## 2013-03-07 ENCOUNTER — Encounter: Payer: Self-pay | Admitting: Obstetrics and Gynecology

## 2013-05-01 ENCOUNTER — Other Ambulatory Visit: Payer: Self-pay | Admitting: Obstetrics and Gynecology

## 2013-06-17 ENCOUNTER — Other Ambulatory Visit: Payer: Self-pay | Admitting: Dermatology

## 2013-07-17 ENCOUNTER — Ambulatory Visit (INDEPENDENT_AMBULATORY_CARE_PROVIDER_SITE_OTHER): Payer: 59

## 2013-07-17 DIAGNOSIS — Z23 Encounter for immunization: Secondary | ICD-10-CM

## 2014-08-05 ENCOUNTER — Telehealth: Payer: Self-pay | Admitting: Family Medicine

## 2014-08-05 NOTE — Telephone Encounter (Signed)
Pt wtbs tomorrow but declined 10 am appt w/bill and was given appt w/bill on Friday @ 10:15

## 2014-08-08 ENCOUNTER — Ambulatory Visit: Payer: Self-pay | Admitting: Family Medicine

## 2014-08-14 ENCOUNTER — Telehealth: Payer: Self-pay | Admitting: Family Medicine

## 2014-08-15 NOTE — Telephone Encounter (Signed)
Lm to call back

## 2015-08-15 ENCOUNTER — Ambulatory Visit (INDEPENDENT_AMBULATORY_CARE_PROVIDER_SITE_OTHER): Payer: BLUE CROSS/BLUE SHIELD | Admitting: Family Medicine

## 2015-08-15 VITALS — BP 116/80 | HR 88 | Temp 98.1°F | Wt 154.2 lb

## 2015-08-15 DIAGNOSIS — L509 Urticaria, unspecified: Secondary | ICD-10-CM

## 2015-08-15 DIAGNOSIS — T7840XA Allergy, unspecified, initial encounter: Secondary | ICD-10-CM

## 2015-08-15 MED ORDER — PREDNISONE 10 MG PO TABS: ORAL_TABLET | ORAL | Status: DC

## 2015-08-15 NOTE — Patient Instructions (Signed)
Drink plenty of fluids Take prednisone as directed Continue to take Benadryl every 4-6 hours as tolerated Only take cool baths

## 2015-08-15 NOTE — Progress Notes (Signed)
   Subjective:    Patient ID: Denise Fowler, female    DOB: 1976-04-10, 39 y.o.   MRN: [id number]  HPI Patient is here today with hives on her legs and lower torso. Patient feels that it may be coming from taking too much zinc. The hives started Saturday and she went to urgent care yesterday. They gave her 55m of Depo-Medrol.   Review of Systems  Skin: Positive for rash.       Bilateral legs and lower torso.   All other systems reviewed and are negative.      There are no active problems to display for this patient.  Outpatient Encounter Prescriptions as of 08/15/2015  Medication Sig  . cyanocobalamin 1000 MCG tablet Take 100 mcg by mouth daily.  . ferrous fumarate (HEMOCYTE - 106 MG FE) 325 (106 FE) MG TABS tablet Take 1 tablet by mouth.  . fluticasone (FLONASE) 50 MCG/ACT nasal spray Place into both nostrils daily.   No facility-administered encounter medications on file as of 08/15/2015.       Objective:   Physical Exam  Constitutional: She is oriented to person, place, and time. She appears well-developed and well-nourished. No distress.  HENT:  Head: Normocephalic and atraumatic.  Eyes: Conjunctivae and EOM are normal. Pupils are equal, round, and reactive to light. Right eye exhibits no discharge. Left eye exhibits no discharge. No scleral icterus.  Neck: Normal range of motion.  Cardiovascular: Normal rate, regular rhythm and normal heart sounds.   At 84/m  Pulmonary/Chest: Effort normal and breath sounds normal. She has no wheezes. She has no rales.  Musculoskeletal: She exhibits tenderness.  The patient has muscle soreness and tenderness and stiffness with walking. This is especially apparent and lower extremities. The Muscles were tender just to palpation.  Neurological: She is alert and oriented to person, place, and time.  Skin: Skin is warm and dry. Rash noted. There is erythema. No pallor.  Multiple areas on legs and trunk of a flattened rash. Minimally pruritic.   Psychiatric: She has a normal mood and affect. Her behavior is normal. Judgment and thought content normal.  Nursing note and vitals reviewed.  BP 116/80 mmHg  Pulse 88  Temp(Src) 98.1 F (36.7 C) (Oral)  Wt 154 lb 3.2 oz (69.945 kg)        Assessment & Plan:  1. Hives - BMP8+EGFR - CBC with Differential - CK total and [facility]MB [facility]card[facility] predniSONE (DELTASONE) 10 MG tablet; 1 tablet 4 times a day for 2 days,  1 tablet 3 times a day for 2 days,  1 tablet 2 times a day for 2 days, 1 tablet daily for 2 days  Dispense: 20 tablet; Refill: 0  2. Allergic reaction, initial encounter -Drink plenty of fluids and stay cool - predniSONE (DELTASONE) 10 MG tablet; 1 tablet 4 times a day for 2 days,  1 tablet 3 times a day for 2 days,  1 tablet 2 times a day for 2 days, 1 tablet daily for 2 days  Dispense: 20 tablet; Refill: 0  Patient Instructions  Drink plenty of fluids Take prednisone as directed Continue to take Benadryl every 4-6 hours as tolerated Only take cool baths   DArrie SenateMD

## 2015-08-18 ENCOUNTER — Encounter: Payer: Self-pay | Admitting: Cardiovascular Disease

## 2015-08-18 ENCOUNTER — Other Ambulatory Visit: Payer: Self-pay | Admitting: *Deleted

## 2015-08-18 ENCOUNTER — Telehealth: Payer: Self-pay | Admitting: *Deleted

## 2015-08-18 ENCOUNTER — Ambulatory Visit: Payer: Self-pay | Admitting: Cardiovascular Disease

## 2015-08-18 ENCOUNTER — Ambulatory Visit (INDEPENDENT_AMBULATORY_CARE_PROVIDER_SITE_OTHER): Payer: BLUE CROSS/BLUE SHIELD | Admitting: Cardiovascular Disease

## 2015-08-18 VITALS — BP 100/72 | HR 67 | Ht 65.0 in | Wt 156.2 lb

## 2015-08-18 DIAGNOSIS — T783XXA Angioneurotic edema, initial encounter: Secondary | ICD-10-CM

## 2015-08-18 DIAGNOSIS — D692 Other nonthrombocytopenic purpura: Secondary | ICD-10-CM | POA: Insufficient documentation

## 2015-08-18 DIAGNOSIS — R600 Localized edema: Secondary | ICD-10-CM | POA: Diagnosis not present

## 2015-08-18 HISTORY — DX: Localized edema: R60.0

## 2015-08-18 HISTORY — DX: Other nonthrombocytopenic purpura: D69.2

## 2015-08-18 LAB — CBC WITH DIFFERENTIAL/PLATELET
BASOS: 0 %
Basophils Absolute: 0 10*3/uL (ref 0.0–0.2)
EOS (ABSOLUTE): 0.1 10*3/uL (ref 0.0–0.4)
Eos: 1 %
HEMOGLOBIN: 12.4 g/dL (ref 11.1–15.9)
Hematocrit: 37.5 % (ref 34.0–46.6)
IMMATURE GRANS (ABS): 0 10*3/uL (ref 0.0–0.1)
IMMATURE GRANULOCYTES: 0 %
LYMPHS: 26 %
Lymphocytes Absolute: 1.8 10*3/uL (ref 0.7–3.1)
MCH: 30.5 pg (ref 26.6–33.0)
MCHC: 33.1 g/dL (ref 31.5–35.7)
MCV: 92 fL (ref 79–97)
MONOCYTES: 9 %
Monocytes Absolute: 0.6 10*3/uL (ref 0.1–0.9)
NEUTROS ABS: 4.5 10*3/uL (ref 1.4–7.0)
Neutrophils: 64 %
Platelets: 344 10*3/uL (ref 150–379)
RBC: 4.07 x10E6/uL (ref 3.77–5.28)
RDW: 13.7 % (ref 12.3–15.4)
WBC: 7.1 10*3/uL (ref 3.4–10.8)

## 2015-08-18 LAB — BMP8+EGFR
BUN/Creatinine Ratio: 17 (ref 9–23)
BUN: 12 mg/dL (ref 6–24)
CALCIUM: 9.2 mg/dL (ref 8.7–10.2)
CO2: 23 mmol/L (ref 18–29)
CREATININE: 0.69 mg/dL (ref 0.57–1.00)
Chloride: 101 mmol/L (ref 97–106)
GFR, EST AFRICAN AMERICAN: 126 mL/min/{1.73_m2} (ref >59)
GFR, EST NON AFRICAN AMERICAN: 109 mL/min/{1.73_m2} (ref >59)
Glucose: 102 mg/dL — ABNORMAL HIGH (ref 65–99)
Potassium: 4.8 mmol/L (ref 3.5–5.2)
Sodium: 140 mmol/L (ref 136–144)

## 2015-08-18 LAB — CK TOTAL AND CKMB (NOT AT ARMC)
CK TOTAL: 36 U/L (ref 24–173)
CK-MB Index: <1 ng/mL (ref 0.0–5.3)

## 2015-08-18 NOTE — Progress Notes (Signed)
Cardiology Office Note   Date:  08/18/2015   ID:  Denise Fowler, DOB [birth year], MRN [id number]  PCP:  Rudi Heap, MD  Cardiologist:   Vesta Mixer, MD   Chief Complaint  Patient presents with  . Leg Swelling   Problem List 1. Ankle edema 2. rash   History of Present Illness: Denise Fowler is a 39 y.o. female who presents for evaluation for ankle edema and a diffuse rash.  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  Had a severe sore throat several weeks ago ,  Took a Z-pak .   Had a cold last week.  Took lots of Zinc and vit C.  , may have taken more than she was supposed. To  Within 2 hours, developed diffuse rash.started on leg Spread to both legs by morning. Developed joint pain the next day ,  Got a cortisone shot at Urgent Care.   Recalls ( by talking to her mother ) that she tried zinc in the past but had to stop because of a rash .   No CP or dyspnea No tick bites    No past medical history on file.  Past Surgical History  Procedure Laterality Date  . Tonsillectomy and adenoidectomy       Current Outpatient Prescriptions  Medication Sig Dispense Refill  . cyanocobalamin 1000 MCG tablet Take 100 mcg by mouth daily.    . ferrous fumarate (HEMOCYTE - 106 MG FE) 325 (106 FE) MG TABS tablet Take 1 tablet by mouth.    . fluticasone (FLONASE) 50 MCG/ACT nasal spray Place into both nostrils daily.    . minocycline (MINOCIN,DYNACIN) 100 MG capsule Take 100 mg by mouth 2 (two) times daily as needed. AS NEEDED FOR ACNE  2  . predniSONE (DELTASONE) 10 MG tablet 1 tablet 4 times a day for 2 days,  1 tablet 3 times a day for 2 days,  1 tablet 2 times a day for 2 days, 1 tablet daily for 2 days 20 tablet 0   No current facility-administered medications for this visit.    Allergies:   Penicillin g    Social History:  The patient  reports that she has never smoked. She does not have any smokeless  tobacco history on file. She reports that she does not drink alcohol or use illicit drugs.   Family History:  The patient's family history is not on file.    ROS:  Please see the history of present illness.    Review of Systems: Constitutional:  denies fever, chills, diaphoresis, appetite change and fatigue.  HEENT: denies photophobia, eye pain, redness, hearing loss, ear pain, congestion, sore throat, rhinorrhea, sneezing, neck pain, neck stiffness and tinnitus.  Respiratory: denies SOB, DOE, cough, chest tightness, and wheezing.  Cardiovascular: denies chest pain, palpitations and leg swelling.  Gastrointestinal: denies nausea, vomiting, abdominal pain, diarrhea, constipation, blood in stool.  Genitourinary: denies dysuria, urgency, frequency, hematuria, flank pain and difficulty urinating.  Musculoskeletal: denies  myalgias, back pain, joint swelling, arthralgias and gait problem.   Skin: denies pallor, rash and wound.  Neurological: denies dizziness, seizures, syncope, weakness, light-headedness, numbness and headaches.   Hematological: denies adenopathy, easy bruising, personal or family bleeding history.  Psychiatric/ Behavioral: denies suicidal ideation, mood changes, confusion, nervousness, sleep disturbance and agitation.       All other systems are reviewed and negative.    PHYSICAL EXAM: VS:  BP 100/72 mmHg  Pulse 67  Ht 5\' 5"  (1.651 m)  Wt 156 lb 3.2 oz (70.852 kg)  BMI 25.99 kg/m2 , BMI Body mass index is 25.99 kg/(m^2). GEN: Well nourished, well developed, in no acute distress HEENT: normal Neck: no JVD, carotid bruits, or masses Cardiac: RRR; no murmurs, rubs, or gallops,no edema  Respiratory:  clear to auscultation bilaterally, normal work of breathing GI: soft, nontender, nondistended, + BS MS: no deformity or atrophy Skin: warm and dry, no rash Neuro:  Strength and sensation are intact Psych: normal   EKG:  EKG is ordered today. The ekg ordered today  demonstrates NSR at 67.  No ST or T wave changes.    Recent Labs: 08/15/2015: BUN 12; Creatinine, Ser 0.69; Potassium 4.8; Sodium 140    Lipid Panel No results found for: CHOL, TRIG, HDL, CHOLHDL, VLDL, LDLCALC, LDLDIRECT    Wt Readings from Last 3 Encounters:  08/18/15 156 lb 3.2 oz (70.852 kg)  08/15/15 154 lb 3.2 oz (69.945 kg)      Other studies Reviewed: Additional studies/ records that were reviewed today include: . Review of the above records demonstrates:    ASSESSMENT AND PLAN:  1.  Purpura: Denise Fowler presents today with diffuse purpuric rash. I asked Denise Fowler our pharmacist come back and review. The rash first appeared after she took high doses of zinc and vitamin C but it really does not look like a drug rash. 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 She's had basic labs drawn including CBC, basic medical profile, total CPK. All of these are in the normal range. I've advised her to go to the dermatologist tomorrow and see if she can get a skin biopsy.  This associated with some very mild ankle edema and I do not think that this cycle edema is due to any cardiovascular issue. She has no other signs or symptoms of congestive heart failure.  I've advised her to call me back she has any further cardiac issues but this point I can see her on an as-needed basis. I've requested that she call us once the diagnosis is made for our information.   Current medicines are reviewed at length with the patient today.  The patient does not have concerns regarding medicines.  The following changes have been made:  no change  Labs/ tests ordered today include:  No orders of the defined types were placed in this encounter.    Disposition:   FU with me as needed.     Nahser, Deloris PingPhilip J, MD  08/18/2015 4:06 PM    [facility] [address], [place], [place]  [address] Phone: [phone]; Fax: [phone]) [phone]   [facility]  [address] [place], [place]  [address] [phone]   Fax [phone]

## 2015-08-18 NOTE — Telephone Encounter (Signed)
The patient is not having any shortness of breath and she could be experiencing some angioedema as a result of taking excessive zinc. We will like her to be seen by the cardiologist today for any other suggestions for managing this issue.

## 2015-08-18 NOTE — Patient Instructions (Signed)
Medication Instructions:  Your physician recommends that you continue on your current medications as directed. Please refer to the Current Medication list given to you today.   Labwork: None Ordered   Testing/Procedures: None Ordered   Follow-Up: Your physician recommends that you schedule a follow-up appointment in: as needed with Dr. Nahser.    If you need a refill on your cardiac medications before your next appointment, please call your pharmacy.   

## 2015-08-18 NOTE — Telephone Encounter (Signed)
Patient states that the rash on her legs is some better but she still has the pitting edema. She also states that the rash is starting to go up her back and is starting to become painful.

## 2015-08-18 NOTE — Telephone Encounter (Signed)
Called patent she does not have exact dose of zinc that she took but last dose was last Wednesday.  I told her that it should have cleared her system within 24-72 hours.  She has been hydrating.  Patient still has a rash and edema in ankles.  She has been hydrating.  CBC and CKMB are all normal.  I have let Dr. Christell ConstantMoore know.  No SOB.

## 2015-08-18 NOTE — Telephone Encounter (Signed)
Please discuss this with me before you leave today

## 2015-10-13 ENCOUNTER — Telehealth: Payer: Self-pay | Admitting: Family Medicine

## 2023-10-24 ENCOUNTER — Encounter (HOSPITAL_BASED_OUTPATIENT_CLINIC_OR_DEPARTMENT_OTHER): Payer: Self-pay | Admitting: Emergency Medicine

## 2023-10-24 ENCOUNTER — Other Ambulatory Visit: Payer: Self-pay

## 2023-10-24 ENCOUNTER — Emergency Department (HOSPITAL_BASED_OUTPATIENT_CLINIC_OR_DEPARTMENT_OTHER): Payer: BC Managed Care – PPO | Admitting: Radiology

## 2023-10-24 ENCOUNTER — Emergency Department (HOSPITAL_BASED_OUTPATIENT_CLINIC_OR_DEPARTMENT_OTHER)
Admission: EM | Admit: 2023-10-24 | Discharge: 2023-10-24 | Disposition: A | Discharge status: Home / Self Care | Payer: BC Managed Care – PPO | Attending: Emergency Medicine | Admitting: Emergency Medicine

## 2023-10-24 DIAGNOSIS — R0789 Other chest pain: Secondary | ICD-10-CM

## 2023-10-24 DIAGNOSIS — R072 Precordial pain: Secondary | ICD-10-CM | POA: Insufficient documentation

## 2023-10-24 DIAGNOSIS — R002 Palpitations: Secondary | ICD-10-CM | POA: Diagnosis not present

## 2023-10-24 LAB — BASIC METABOLIC PANEL
Anion gap: 8 (ref 5–15)
BUN: 16 mg/dL (ref 6–20)
CO2: 28 mmol/L (ref 22–32)
Calcium: 10 mg/dL (ref 8.9–10.3)
Chloride: 101 mmol/L (ref 98–111)
Creatinine, Ser: 0.79 mg/dL (ref 0.44–1.00)
GFR, Estimated: >60 mL/min (ref >60)
Glucose, Bld: 95 mg/dL (ref 70–99)
Potassium: 4 mmol/L (ref 3.5–5.1)
Sodium: 137 mmol/L (ref 135–145)

## 2023-10-24 LAB — CBC
HCT: 41.3 % (ref 36.0–46.0)
Hemoglobin: 14.2 g/dL (ref 12.0–15.0)
MCH: 31.8 pg (ref 26.0–34.0)
MCHC: 34.4 g/dL (ref 30.0–36.0)
MCV: 92.4 fL (ref 80.0–100.0)
Platelets: 362 10*3/uL (ref 150–400)
RBC: 4.47 MIL/uL (ref 3.87–5.11)
RDW: 11.9 % (ref 11.5–15.5)
WBC: 8.2 10*3/uL (ref 4.0–10.5)
nRBC: 0 % (ref 0.0–0.2)

## 2023-10-24 LAB — TROPONIN I (HIGH SENSITIVITY)
Troponin I (High Sensitivity): <2 ng/L (ref <18)
Troponin I (High Sensitivity): <2 ng/L (ref <18)

## 2023-10-24 NOTE — ED Provider Notes (Signed)
 [place] EMERGENCY DEPARTMENT AT [facility] Provider Note   CSN: [id number] Arrival date & time: 10/24/23  1552     History  Chief Complaint  Patient presents with   Chest Pain    Denise Fowler is a 48 y.o. female.  Patient presents to the ED from UC. She reports palpitations since New Years Day. Chest pressure with radiation to left shoulder. No shortness of breath, diaphoresis. UC noted LBB on ECG. No prior history of same.  The history is provided by the patient. No language interpreter was used.  Chest Pain Pain location:  Substernal area Pain quality: pressure and tightness   Pain radiates to:  L shoulder Pain severity:  Mild Onset quality:  Gradual Duration:  2 days Progression:  Waxing and waning Chronicity:  New Associated symptoms: no abdominal pain, no back pain, no cough, no fever, no shortness of breath and no syncope        Home Medications Prior to Admission medications   Medication Sig Start Date End Date Taking? Authorizing Provider  cyanocobalamin 1000 MCG tablet Take 100 mcg by mouth daily.    [provider]  ferrous fumarate (HEMOCYTE - 106 MG FE) 325 (106 FE) MG TABS tablet Take 1 tablet by mouth.    [provider]  fluticasone (FLONASE) 50 MCG/ACT nasal spray Place into both nostrils daily.    [provider]  minocycline (MINOCIN,DYNACIN) 100 MG capsule Take 100 mg by mouth 2 (two) times daily as needed. AS NEEDED FOR ACNE 08/04/15   [provider]  predniSONE  (DELTASONE ) 10 MG tablet 1 tablet 4 times a day for 2 days,  1 tablet 3 times a day for 2 days,  1 tablet 2 times a day for 2 days, 1 tablet daily for 2 days 08/15/15   Georgina Nancyann ORN, MD      Allergies    Penicillin g    Review of Systems   Review of Systems  Constitutional:  Negative for fever.  Respiratory:  Negative for cough and shortness of breath.   Cardiovascular:  Positive for chest pain. Negative for syncope.   Gastrointestinal:  Negative for abdominal pain.  Musculoskeletal:  Negative for back pain.  All other systems reviewed and are negative.   Physical Exam Updated Vital Signs BP 126/70 (BP Location: Right Arm)   Pulse 99   Temp 98 F (36.7 C)   Resp 16   Ht 5' 4 (1.626 m)   Wt 65.8 kg   SpO2 99%   BMI 24.89 kg/m  Physical Exam HENT:     Head: Normocephalic.  Cardiovascular:     Rate and Rhythm: Normal rate. Rhythm irregular.     Heart sounds: Normal heart sounds.  Pulmonary:     Effort: Pulmonary effort is normal.     Breath sounds: Normal breath sounds.  Musculoskeletal:        General: Normal range of motion.     Right lower leg: No edema.     Left lower leg: No edema.  Skin:    General: Skin is warm and dry.  Neurological:     Mental Status: She is alert and oriented to person, place, and time.  Psychiatric:        Mood and Affect: Mood normal.        Behavior: Behavior normal.     ED Results / Procedures / Treatments   Labs (all labs ordered are listed, but only abnormal results are displayed) Labs Reviewed  BASIC METABOLIC PANEL  CBC  TROPONIN I (HIGH SENSITIVITY)  TROPONIN I (HIGH SENSITIVITY)    EKG EKG Interpretation Date/Time:  Tuesday October 24 2023 16:03:42 EST Ventricular Rate:  85 PR Interval:  142 QRS Duration:  130 QT Interval:  392 QTC Calculation: 466 R Axis:   45  Text Interpretation: Normal sinus rhythm with sinus arrhythmia Left bundle branch block Abnormal ECG No previous ECGs available Confirmed by Yolande Charleston [phone]) on 10/24/2023 4:06:34 PM  Radiology DG Chest 2 View Result Date: 10/24/2023 CLINICAL DATA:  Chest pain and heaviness EXAM: CHEST - 2 VIEW COMPARISON:  None Available. FINDINGS: The heart size and mediastinal contours are within normal limits. Both lungs are clear. The visualized skeletal structures are unremarkable. External artifact over the right upper lobe. IMPRESSION: No active cardiopulmonary disease.  Electronically Signed   By: CHRISTELLA.  Shick M.D.   On: 10/24/2023 16:24    Procedures Procedures    Medications Ordered in ED Medications - No data to display  ED Course/ Medical Decision Making/ A&P                                 Medical Decision Making Amount and/or Complexity of Data Reviewed Labs: ordered. Radiology: ordered.   Heart Score 1.  Consulted with cardiology (Nate). Outpatient follow-up appropriate.  Patient is to be discharged with recommendation to follow up with PCP in regards to today's hospital visit. Chest pain is not likely of cardiac or pulmonary etiology d/t presentation, perc negative, VSS, no tracheal deviation, no JVD or new murmur, RRR, breath sounds equal bilaterally, EKG without acute abnormalities, negative troponin, and negative CXR. Pt has been advised start a PPI and return to the ED is CP becomes exertional, associated with diaphoresis or nausea, radiates to left jaw/arm, worsens or becomes concerning in any way. Pt appears reliable for follow up and is agreeable to discharge.   Case has been discussed with Dr. Yolande who agrees with the above plan to discharge.          Final Clinical Impression(s) / ED Diagnoses Final diagnoses:  Other chest pain    Rx / DC Orders ED Discharge Orders     None         Claudene Lenis, NP 10/25/23 0017    Yolande Charleston BROCKS, MD 10/29/23 [phone]

## 2023-10-24 NOTE — Discharge Instructions (Addendum)
 Please refer to the attached instructions. Follow-up with cardiology as discussed.

## 2023-10-24 NOTE — ED Triage Notes (Signed)
 C/o left CP x 2 days. States she's been having "heaviness and increased PVCs" x 1 week. Rads into left arm and neck. No cardiac hx. Sent from UC.

## 2023-12-18 ENCOUNTER — Other Ambulatory Visit: Payer: Self-pay

## 2023-12-18 DIAGNOSIS — R002 Palpitations: Secondary | ICD-10-CM | POA: Insufficient documentation

## 2023-12-18 DIAGNOSIS — R0600 Dyspnea, unspecified: Secondary | ICD-10-CM | POA: Insufficient documentation

## 2023-12-18 DIAGNOSIS — R0789 Other chest pain: Secondary | ICD-10-CM | POA: Insufficient documentation

## 2023-12-18 DIAGNOSIS — I447 Left bundle-branch block, unspecified: Secondary | ICD-10-CM | POA: Insufficient documentation

## 2023-12-18 DIAGNOSIS — R079 Chest pain, unspecified: Secondary | ICD-10-CM | POA: Insufficient documentation

## 2023-12-18 DIAGNOSIS — M79602 Pain in left arm: Secondary | ICD-10-CM | POA: Insufficient documentation

## 2023-12-19 ENCOUNTER — Ambulatory Visit

## 2023-12-19 VITALS — BP 120/70 | HR 79 | Ht 64.6 in | Wt 153.0 lb

## 2023-12-19 DIAGNOSIS — I447 Left bundle-branch block, unspecified: Secondary | ICD-10-CM | POA: Diagnosis not present

## 2023-12-19 DIAGNOSIS — R079 Chest pain, unspecified: Secondary | ICD-10-CM | POA: Diagnosis not present

## 2023-12-19 DIAGNOSIS — R0789 Other chest pain: Secondary | ICD-10-CM

## 2023-12-19 DIAGNOSIS — R072 Precordial pain: Secondary | ICD-10-CM

## 2023-12-19 MED ORDER — METOPROLOL TARTRATE 100 MG PO TABS: 100.0000 mg | ORAL_TABLET | Freq: Once | ORAL | 0 refills | Status: AC

## 2023-12-19 NOTE — Assessment & Plan Note (Signed)
 Discussed diagnosis of left bundle branch block noted on EKG. Relatively asymptomatic other than occasional palpitations which appear related to PVCs and associated chest discomfort with the palpitation.  In the setting of LBBB structural and coronary artery disease workup as reviewed with echocardiogram and cardiac CT to be pursued.  If no significant structural, functional or coronary abnormalities, will be reassuring.

## 2023-12-19 NOTE — Patient Instructions (Signed)
 Medication Instructions:  Your physician recommends that you continue on your current medications as directed. Please refer to the Current Medication list given to you today.   You have a RX for Metoprolol 100 mg to take 2 hours prior to your CT.  *If you need a refill on your cardiac medications before your next appointment, please call your pharmacy*   Lab Work: None ordered   If you have labs (blood work) drawn today and your tests are completely normal, you will receive your results only by: MyChart Message (if you have MyChart) OR A paper copy in the mail If you have any lab test that is abnormal or we need to change your treatment, we will call you to review the results.    Testing/Procedures:   Your cardiac CT will be scheduled at the following location:   [facility] [address], [address] [place],  [place]  [address]  Main: [phone]   On the Night Before the Test: Be sure to Drink plenty of water. Do not consume any caffeinated/decaffeinated beverages or chocolate 12 hours prior to your test. Do not take any antihistamines 12 hours prior to your test.   On the Day of the Test: Drink plenty of water until 1 hour prior to the test. Do not eat any food 1 hour prior to test. You may take your regular medications prior to the test.  Take metoprolol (Lopressor) 100 mg  two hours prior to test. FEMALES- please wear underwire-free bra if available, avoid dresses & tight clothing       After the Test: Drink plenty of water. After receiving IV contrast, you may experience a mild flushed feeling. This is normal. On occasion, you may experience a mild rash up to 24 hours after the test. This is not dangerous. If this occurs, you can take Benadryl 25 mg and increase your fluid intake. If you experience trouble breathing, this can be serious. If it is severe call 911 IMMEDIATELY. If it is mild, please call our office. If  you take any of these medications: Glipizide/Metformin, Avandament, Glucavance, please do not take 48 hours after completing test unless otherwise instructed.  We will call to schedule your test 2-4 weeks out understanding that some insurance companies will need an authorization prior to the service being performed.   For non-scheduling related questions, please contact the cardiac imaging nurse navigator should you have any questions/concerns: Rockwell [place], Cardiac Imaging Nurse Navigator Larey Brick, Cardiac Imaging Nurse Navigator [facility] Direct Office Dial: [phone]   For scheduling needs, including cancellations and rescheduling, please call [place], [phone].   Your physician has requested that you have an echocardiogram. Echocardiography is a painless test that uses sound waves to create images of your heart. It provides your doctor with information about the size and shape of your heart and how well your heart's chambers and valves are working. This procedure takes approximately one hour. There are no restrictions for this procedure. Please do NOT wear cologne, perfume, aftershave, or lotions (deodorant is allowed). Please arrive 15 minutes prior to your appointment time.   Your next appointment:   Follow up based on results  The format for your next appointment:   In Person  Provider:   Belva Crome, MD   Other Instructions Cardiac CT Angiogram A cardiac CT angiogram is a procedure to look at the heart and the area around the heart. It may  be done to help find the cause of chest pains or other symptoms of heart disease. During this procedure, a substance called contrast dye is injected into the blood vessels in the area to be checked. A large X-ray machine, called a CT scanner, then takes detailed pictures of the heart and the surrounding area. The procedure is also sometimes called a coronary CT angiogram, coronary artery scanning,  or CTA. A cardiac CT angiogram allows the health care provider to see how well blood is flowing to and from the heart. The health care provider will be able to see if there are any problems, such as: Blockage or narrowing of the coronary arteries in the heart. Fluid around the heart. Signs of weakness or disease in the muscles, valves, and tissues of the heart. Tell a health care provider about: Any allergies you have. This is especially important if you have had a previous allergic reaction to contrast dye. All medicines you are taking, including vitamins, herbs, eye drops, creams, and over-the-counter medicines. Any blood disorders you have. Any surgeries you have had. Any medical conditions you have. Whether you are pregnant or may be pregnant. Any anxiety disorders, chronic pain, or other conditions you have that may increase your stress or prevent you from lying still. What are the risks? Generally, this is a safe procedure. However, problems may occur, including: Bleeding. Infection. Allergic reactions to medicines or dyes. Damage to other structures or organs. Kidney damage from the contrast dye that is used. Increased risk of cancer from radiation exposure. This risk is low. Talk with your health care provider about: The risks and benefits of testing. How you can receive the lowest dose of radiation. What happens before the procedure? Wear comfortable clothing and remove any jewelry, glasses, dentures, and hearing aids. Follow instructions from your health care provider about eating and drinking. This may include: For 12 hours before the procedure -- avoid caffeine. This includes tea, coffee, soda, energy drinks, and diet pills. Drink plenty of water or other fluids that do not have caffeine in them. Being well hydrated can prevent complications. For 4-6 hours before the procedure -- stop eating and drinking. The contrast dye can cause nausea, but this is less likely if your  stomach is empty. Ask your health care provider about changing or stopping your regular medicines. This is especially important if you are taking diabetes medicines, blood thinners, or medicines to treat problems with erections (erectile dysfunction). What happens during the procedure?  Hair on your chest may need to be removed so that small sticky patches called electrodes can be placed on your chest. These will transmit information that helps to monitor your heart during the procedure. An IV will be inserted into one of your veins. You might be given a medicine to control your heart rate during the procedure. This will help to ensure that good images are obtained. You will be asked to lie on an exam table. This table will slide in and out of the CT machine during the procedure. Contrast dye will be injected into the IV. You might feel warm, or you may get a metallic taste in your mouth. You will be given a medicine called nitroglycerin. This will relax or dilate the arteries in your heart. The table that you are lying on will move into the CT machine tunnel for the scan. The person running the machine will give you instructions while the scans are being done. You may be asked to: Keep your arms  above your head. Hold your breath. Stay very still, even if the table is moving. When the scanning is complete, you will be moved out of the machine. The IV will be removed. The procedure may vary among health care providers and hospitals. What can I expect after the procedure? After your procedure, it is common to have: A metallic taste in your mouth from the contrast dye. A feeling of warmth. A headache from the nitroglycerin. Follow these instructions at home: Take over-the-counter and prescription medicines only as told by your health care provider. If you are told, drink enough fluid to keep your urine pale yellow. This will help to flush the contrast dye out of your body. Most people can  return to their normal activities right after the procedure. Ask your health care provider what activities are safe for you. It is up to you to get the results of your procedure. Ask your health care provider, or the department that is doing the procedure, when your results will be ready. Keep all follow-up visits as told by your health care provider. This is important. Contact a health care provider if: You have any symptoms of allergy to the contrast dye. These include: Shortness of breath. Rash or hives. A racing heartbeat. Summary A cardiac CT angiogram is a procedure to look at the heart and the area around the heart. It may be done to help find the cause of chest pains or other symptoms of heart disease. During this procedure, a large X-ray machine, called a CT scanner, takes detailed pictures of the heart and the surrounding area after a contrast dye has been injected into blood vessels in the area. Ask your health care provider about changing or stopping your regular medicines before the procedure. This is especially important if you are taking diabetes medicines, blood thinners, or medicines to treat erectile dysfunction. If you are told, drink enough fluid to keep your urine pale yellow. This will help to flush the contrast dye out of your body. This information is not intended to replace advice given to you by your health care provider. Make sure you discuss any questions you have with your health care provider. Document Revised: 05/22/2019 Document Reviewed: 05/22/2019 Elsevier Patient Education  [employer].  Echocardiogram An echocardiogram is a test that uses sound waves to make images of your heart. This way of making images is often called ultrasound. The images from this test can help find out many things about your heart, including: The size and shape of your heart. The strength of your heart muscle and how well it's working. The size, thickness, and movement of your  heart's walls. How your heart valves are working. Problems such as: A tumor or a growth from an infection around the heart valves. Areas of heart muscle that aren't working well because of poor blood flow or injury from a heart attack. An aneurysm. This is a weak or damaged part of an artery wall. An artery is a blood vessel. Tell a health care provider about: Any allergies you have. All medicines you're taking, including vitamins, herbs, eye drops, creams, and over-the-counter medicines. Any bleeding problems you have. Any surgeries you've had. Any medical problems you have. Whether you're pregnant or may be pregnant. What are the risks? Your health care provider will talk with you about risks. These may include an allergic reaction to IV dye that may be used during the test. What happens before the test? You don't need to do anything  to get ready for this test. You may eat and drink normally. What happens during the test?  You'll take off your clothes from the waist up and put on a hospital gown. Sticky patches called electrodes may be placed on your chest. These will be connected to a machine that monitors your heart rate and rhythm. You'll lie down on a table for the exam. A wand covered in gel will be moved over your chest. Sound waves from the wand will go to your heart and bounce back--or "echo" back. The sound waves will go to a computer that uses them to make images of your heart. The images can be viewed on a monitor. The images will also be recorded on the computer so your provider can look at them later. You may be asked to change positions or hold your breath for a short time. This makes it easier to get different views or better views of your heart. In some cases, you may be given a dye through an IV. The IV is put into one of your veins. This dye can make the areas of your heart easier to see. The procedure may vary among providers and hospitals. What can I expect after the  test? You may return to your normal diet, activities, and medicines unless your provider tells you not to. If an IV was placed for the test, it will be removed. It's up to you to get the results of your test. Ask your provider, or the department that's doing the test, when your results will be ready. This information is not intended to replace advice given to you by your health care provider. Make sure you discuss any questions you have with your health care provider. Document Revised: 11/25/2022 Document Reviewed: 11/25/2022 Elsevier Patient Education  2024 [employer].

## 2023-12-19 NOTE — Progress Notes (Signed)
 Cardiology Consultation:    Date:  12/19/2023   ID:  Denise Fowler, DOB 09-Apr-1976, MRN [id number]  PCP:  Mechele Claude, MD  Cardiologist:  Marlyn Corporal Keiji Melland, MD   Referring MD: Mechele Claude, MD   No chief complaint on file.    ASSESSMENT AND PLAN:   Denise Fowler 48 year old woman with no significant prior cardiac history, was diagnosed with IgA nephropathy/Henoch-Schnlein purpura in 2016 and symptoms well-controlled currently without any active flareup and kidney function normal currently at baseline, recently started on hormone replacement therapy in October 2024 for menopause symptoms, presenting for new diagnosis of left bundle branch block and occasional palpitations which seem to correlate with isolated PVCs as documented on EKG today. Problem List Items Addressed This Visit     Chest discomfort - Primary   More so appears to be related to palpitations from isolated PVCs.  Infrequent symptoms.  Since January the symptoms have subsided she resumed her magnesium supplementation but she tends to have episodes of extra beats on a daily basis occur once in couple minutes.  No back-to-back sustained episodes.  No lightheadedness, dizziness or syncopal episodes.  She does have left bundle branch block morphology.  Otherwise no significant cardiac risk factors.  In the setting we will proceed with cardiac structure and function assessment with transthoracic echocardiogram and would recommend coronary artery disease workup with a cardiac CT imaging.   - Transthoracic echocardiogram - Cardiac CT coronary angiogram being ordered. -If any significant increase in palpitations observed, will obtain a Zio patch monitor.  Currently given her improvement in symptoms we will hold off on the Zio patch monitor.        Relevant Medications   metoprolol tartrate (LOPRESSOR) 100 MG tablet   LBBB (left bundle branch block)   Discussed diagnosis of left bundle branch block noted on  EKG. Relatively asymptomatic other than occasional palpitations which appear related to PVCs and associated chest discomfort with the palpitation.  In the setting of LBBB structural and coronary artery disease workup as reviewed with echocardiogram and cardiac CT to be pursued.  If no significant structural, functional or coronary abnormalities, will be reassuring.       Relevant Medications   aspirin EC 81 MG tablet   metoprolol tartrate (LOPRESSOR) 100 MG tablet      History of Present Illness:    Denise Fowler is a 48 y.o. female who is being seen today for the evaluation of chest pain and palpitations at the request of Mechele Claude, MD. Follows up with nephrologist Dr. Ophelia Shoulder at [facility] woman here for the visit by herself.  Lives with family members at home.  She is a Engineer, civil (consulting) by training and previously worked with our cardiology group Dr. Graciela Husbands and now works as a Manufacturing systems engineer person.  In December 2016 she was diagnosed with IgA nephropathy/Henoch-Schnlein purpura, recently started hormone replacement therapy in October 2024 after menopause, empirically uses magnesium supplementation.  In her usual state of health until this January when she started having symptoms of palpitations.  With progressive/ongoing symptoms she presented to the ER after initially being seen at urgent care.  Recently had an ER visit 10-24-2023 for symptoms of palpitations and chest pressure and identified to have a left bundle branch block on EKG.  Serial troponins high-sensitivity were unremarkable.  Scheduled for outpatient follow-up with cardiology.  She mentions her overall intensity and frequency of palpitations has reduced.  She still feels episodes of PVCs on and off which she  feels like skipped beat or an extra beat sensation occurring once in couple minutes, notices more so when at rest.  No sustained back-to-back episodes.  No syncopal or near syncopal episodes.  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 Has noticed no significant change in her functional capacity.  Does not smoke. Drinks alcohol, wine socially 1-2 drinks. No illicit drugs.  EKG in the clinic today shows sinus rhythm heart rate 79/min, PR interval 144 ms, with wide QRS consistent with a left bundle branch block.  Similar to prior EKG available for comparison from October 24, 2023.  CBC from 10/24/2023 hemoglobin 14.2, hematocrit 41.3, platelets 362, WBC 8, platelets 362  BUN 16, creatinine 0.79, normal electrolytes   Past Medical History:  Diagnosis Date   Chest pain    Dyspnea    LBBB (left bundle branch block)    Left arm pain    Leg edema 08/18/2015   Palpitation    Purpura (HCC) 08/18/2015    Past Surgical History:  Procedure Laterality Date   TONSILLECTOMY AND ADENOIDECTOMY      Current Medications: Current Meds  Medication Sig   aspirin EC 81 MG tablet Take 81 mg by mouth at bedtime.   estradiol (ESTRACE) 0.5 MG tablet Take 0.5 mg by mouth at bedtime.   metoprolol tartrate (LOPRESSOR) 100 MG tablet Take 1 tablet (100 mg total) by mouth once for 1 dose. Take 2 hours prior to your CT if your heart rate is greater than 55   Naltrexone HCl, Pain, 4.5 MG CAPS Take 4.5 mg by mouth at bedtime.   progesterone (PROMETRIUM) 200 MG capsule Take 200 mg by mouth at bedtime.     Allergies:   Amoxicillin and Penicillin g   Social History   Socioeconomic History   Marital status: Widowed    Spouse name: Not on file   Number of children: Not on file   Years of education: Not on file   Highest education level: Not on file  Occupational History   Not on file  Tobacco Use   Smoking status: Never   Smokeless tobacco: Not on file  Substance and Sexual Activity   Alcohol use: No   Drug use: No   Sexual activity: Not on file  Other Topics Concern   Not on file  Social History Narrative   Not on file   Social Drivers of Health   Financial Resource Strain:  Not on file  Food Insecurity: Not on file  Transportation Needs: Not on file  Physical Activity: Not on file  Stress: Not on file  Social Connections: Not on file     Family History: The patient's family history includes Cancer in her maternal grandfather, maternal grandmother, paternal grandfather, and paternal grandmother. There is no history of Hypertension, Heart disease, or Diabetes. ROS:   Please see the history of present illness.    All 14 point review of systems negative except as described per history of present illness.  EKGs/Labs/Other Studies Reviewed:    The following studies were reviewed today:   EKG:  EKG Interpretation Date/Time:  Tuesday December 19 2023 14:17:19 EDT Ventricular Rate:  79 PR Interval:  144 QRS Duration:  130 QT Interval:  410 QTC Calculation: 470 R Axis:   58  Text Interpretation: Sinus rhythm with occasional Premature ventricular complexes Left bundle branch block When compared with ECG of 24-Oct-2023 16:03, Premature ventricular complexes are now Present Confirmed by Huntley Dec reddy [phone]) on 12/19/2023  2:40:57 PM    Recent Labs: 10/24/2023: BUN 16; Creatinine, Ser 0.79; Hemoglobin 14.2; Platelets 362; Potassium 4.0; Sodium 137  Recent Lipid Panel No results found for: "CHOL", "TRIG", "HDL", "CHOLHDL", "VLDL", "LDLCALC", "LDLDIRECT"  Physical Exam:    VS:  BP 120/70   Pulse 79   Ht 5' 4.6" (1.641 m)   Wt 153 lb (69.4 kg)   SpO2 98%   BMI 25.78 kg/m     Wt Readings from Last 3 Encounters:  12/19/23 153 lb (69.4 kg)  10/24/23 145 lb (65.8 kg)  08/18/15 156 lb 3.2 oz (70.9 kg)     GENERAL:  Well nourished, well developed in no acute distress NECK: No JVD CARDIAC: RRR, S1 and S2 present, no murmurs, no rubs, no gallops CHEST:  Clear to auscultation without rales, wheezing or rhonchi  Extremities: No pitting pedal edema. Pulses bilaterally symmetric with radial 2+ and dorsalis pedis 2+ NEUROLOGIC:  Alert and oriented x  3  Medication Adjustments/Labs and Tests Ordered: Current medicines are reviewed at length with the patient today.  Concerns regarding medicines are outlined above.  Orders Placed This Encounter  Procedures   CT CORONARY MORPH W/CTA COR W/SCORE W/CA W/CM &/OR WO/CM   EKG 12-Lead   ECHOCARDIOGRAM COMPLETE   Meds ordered this encounter  Medications   metoprolol tartrate (LOPRESSOR) 100 MG tablet    Sig: Take 1 tablet (100 mg total) by mouth once for 1 dose. Take 2 hours prior to your CT if your heart rate is greater than 55    Dispense:  1 tablet    Refill:  0    Signed, Merrill Deanda reddy Masaichi Kracht, MD, MPH, [facility]. 12/19/2023 3:12 PM    [facility]

## 2023-12-19 NOTE — Assessment & Plan Note (Addendum)
 More so appears to be related to palpitations from isolated PVCs.  Infrequent symptoms.  Since January the symptoms have subsided she resumed her magnesium supplementation but she tends to have episodes of extra beats on a daily basis occur once in couple minutes.  No back-to-back sustained episodes.  No lightheadedness, dizziness or syncopal episodes.  She does have left bundle branch block morphology.  Otherwise no significant cardiac risk factors.  In the setting we will proceed with cardiac structure and function assessment with transthoracic echocardiogram and would recommend coronary artery disease workup with a cardiac CT imaging.   - Transthoracic echocardiogram - Cardiac CT coronary angiogram being ordered. -If any significant increase in palpitations observed, will obtain a Zio patch monitor.  Currently given her improvement in symptoms we will hold off on the Zio patch monitor.

## 2023-12-22 ENCOUNTER — Telehealth: Payer: Self-pay

## 2023-12-22 NOTE — Telephone Encounter (Signed)
 Caller [place]) is reporting prior authorization information for CTA heart.  Caller stated  [facility] has reviewed information for CTA test of heart [id number] has been approved.  Order# [id number].  Valid 3/11-4/19/25.

## 2023-12-27 ENCOUNTER — Telehealth: Payer: Self-pay | Admitting: Family Medicine

## 2023-12-27 ENCOUNTER — Ambulatory Visit: Payer: BC Managed Care – PPO | Admitting: Cardiology

## 2023-12-27 NOTE — Telephone Encounter (Signed)
 Copied from CRM [id number]. Topic: Clinical - Request for Lab/Test Order >> Dec 21, 2023  4:38 PM [name] wrote: Reason for CRM: Patient wants to [facility] if CT scan was approved by insurance yet so that she can get scheduled. Patient is requesting a callback with an update.

## 2023-12-27 NOTE — Telephone Encounter (Signed)
 Left message making pt aware that CT scan was approved and she is scheduled to have it done on 01/05/2024 at 8:30 am and will probably need to arrive 15-30 min early. [facility]   Copied from CRM [id number]. Topic: Clinical - Request for Lab/Test Order >> Dec 21, 2023  4:38 PM [name] wrote: Reason for CRM: Patient wants to [facility] if CT scan was approved by insurance yet so that she can get scheduled. Patient is requesting a callback with an update.

## 2023-12-28 NOTE — Telephone Encounter (Signed)
 At this time, [facility] has not ordered a CT for this Patient. There is a recent CT placed from Dr. Vincent Gros, if this is the CT patient is referencing, she would need to call Dr. Madireddy's office in regards to authorization information.

## 2024-01-03 ENCOUNTER — Encounter (HOSPITAL_COMMUNITY): Payer: Self-pay

## 2024-01-05 ENCOUNTER — Ambulatory Visit (HOSPITAL_COMMUNITY)
Admission: RE | Admit: 2024-01-05 | Discharge: 2024-01-05 | Disposition: A | Discharge status: Home / Self Care | Source: Ambulatory Visit

## 2024-01-05 DIAGNOSIS — R072 Precordial pain: Secondary | ICD-10-CM

## 2024-01-05 DIAGNOSIS — R079 Chest pain, unspecified: Secondary | ICD-10-CM

## 2024-01-05 MED ORDER — IOHEXOL 350 MG/ML SOLN
95.0000 mL | Freq: Once | INTRAVENOUS | Status: AC | PRN
  Administered 2024-01-05: 95 mL via INTRAVENOUS

## 2024-01-05 MED ORDER — NITROGLYCERIN 0.4 MG SL SUBL
0.8000 mg | SUBLINGUAL_TABLET | Freq: Once | SUBLINGUAL | Status: AC
  Administered 2024-01-05: 0.8 mg via SUBLINGUAL

## 2024-01-05 MED ORDER — NITROGLYCERIN 0.4 MG SL SUBL
SUBLINGUAL_TABLET | SUBLINGUAL | Status: AC
  Filled 2024-01-05: qty 2

## 2024-01-09 ENCOUNTER — Ambulatory Visit (HOSPITAL_BASED_OUTPATIENT_CLINIC_OR_DEPARTMENT_OTHER)
Admission: RE | Admit: 2024-01-09 | Discharge: 2024-01-09 | Disposition: A | Discharge status: Home / Self Care | Source: Ambulatory Visit

## 2024-01-09 DIAGNOSIS — R079 Chest pain, unspecified: Secondary | ICD-10-CM | POA: Diagnosis not present

## 2024-01-09 DIAGNOSIS — R072 Precordial pain: Secondary | ICD-10-CM | POA: Insufficient documentation

## 2024-01-09 LAB — ECHOCARDIOGRAM COMPLETE
AR max vel: 2.47 cm2
AV Area VTI: 2.4 cm2
AV Area mean vel: 2.17 cm2
AV Mean grad: 3 mmHg
AV Peak grad: 4.3 mmHg
Ao pk vel: 1.04 m/s
Area-P 1/2: 3.02 cm2
Calc EF: 64.8 %
MV M vel: 3.3 m/s
MV Peak grad: 43.6 mmHg
S' Lateral: 2.8 cm
Single Plane A2C EF: 68 %
Single Plane A4C EF: 57 %

## 2024-01-24 ENCOUNTER — Telehealth: Payer: Self-pay

## 2024-01-24 NOTE — Telephone Encounter (Signed)
 Caller Malachi Screws) in patient's PCP office wants a copy of patient's notes from 3/11 faxed to their office.  Fax# [phone].

## 2024-02-20 ENCOUNTER — Other Ambulatory Visit: Payer: Self-pay | Admitting: Obstetrics and Gynecology

## 2024-02-20 DIAGNOSIS — Z1231 Encounter for screening mammogram for malignant neoplasm of breast: Secondary | ICD-10-CM
